# Patient Record
Sex: Male | Born: 1960 | Race: White | Hispanic: No | Marital: Married | Smoking: Never smoker
Health system: Southern US, Community
[De-identification: ages and names within clinical notes are randomized; demographics above are authoritative.]

## PROBLEM LIST (undated history)

## (undated) DIAGNOSIS — K219 Gastro-esophageal reflux disease without esophagitis: Secondary | ICD-10-CM

## (undated) HISTORY — PX: OTHER SURGICAL HISTORY: SHX169

## (undated) HISTORY — DX: Gastro-esophageal reflux disease without esophagitis: K21.9

---

## 1997-06-10 ENCOUNTER — Emergency Department (HOSPITAL_COMMUNITY): Admission: EM | Admit: 1997-06-10 | Discharge: 1997-06-10 | Payer: Self-pay | Admitting: Internal Medicine

## 2014-02-23 ENCOUNTER — Encounter: Payer: Self-pay | Admitting: Family Medicine

## 2014-02-23 ENCOUNTER — Ambulatory Visit (INDEPENDENT_AMBULATORY_CARE_PROVIDER_SITE_OTHER): Payer: 59 | Admitting: Family Medicine

## 2014-02-23 VITALS — BP 118/82 | Temp 98.8°F | Ht 72.0 in | Wt 222.0 lb

## 2014-02-23 DIAGNOSIS — R202 Paresthesia of skin: Secondary | ICD-10-CM

## 2014-02-23 DIAGNOSIS — R1013 Epigastric pain: Secondary | ICD-10-CM

## 2014-02-23 DIAGNOSIS — K219 Gastro-esophageal reflux disease without esophagitis: Secondary | ICD-10-CM | POA: Insufficient documentation

## 2014-02-23 DIAGNOSIS — R200 Anesthesia of skin: Secondary | ICD-10-CM | POA: Insufficient documentation

## 2014-02-23 LAB — COMPREHENSIVE METABOLIC PANEL
ALT: 40 U/L (ref 0–53)
AST: 28 U/L (ref 0–37)
Albumin: 4.4 g/dL (ref 3.5–5.2)
Alkaline Phosphatase: 79 U/L (ref 39–117)
BUN: 29 mg/dL — ABNORMAL HIGH (ref 6–23)
CO2: 28 meq/L (ref 19–32)
CREATININE: 1.05 mg/dL (ref 0.40–1.50)
Calcium: 9.6 mg/dL (ref 8.4–10.5)
Chloride: 106 mEq/L (ref 96–112)
GFR: 77.74 mL/min (ref >60.00)
Glucose, Bld: 111 mg/dL — ABNORMAL HIGH (ref 70–99)
POTASSIUM: 4.7 meq/L (ref 3.5–5.1)
SODIUM: 139 meq/L (ref 135–145)
Total Bilirubin: 0.5 mg/dL (ref 0.2–1.2)
Total Protein: 7.5 g/dL (ref 6.0–8.3)

## 2014-02-23 LAB — CBC WITH DIFFERENTIAL/PLATELET
BASOS ABS: 0 10*3/uL (ref 0.0–0.1)
Basophils Relative: 0.3 % (ref 0.0–3.0)
EOS ABS: 0.3 10*3/uL (ref 0.0–0.7)
EOS PCT: 4.2 % (ref 0.0–5.0)
HCT: 47.3 % (ref 39.0–52.0)
HEMOGLOBIN: 16.3 g/dL (ref 13.0–17.0)
Lymphocytes Relative: 19.1 % (ref 12.0–46.0)
Lymphs Abs: 1.2 10*3/uL (ref 0.7–4.0)
MCHC: 34.5 g/dL (ref 30.0–36.0)
MCV: 89.1 fl (ref 78.0–100.0)
Monocytes Absolute: 0.5 10*3/uL (ref 0.1–1.0)
Monocytes Relative: 7.6 % (ref 3.0–12.0)
NEUTROS PCT: 68.8 % (ref 43.0–77.0)
Neutro Abs: 4.2 10*3/uL (ref 1.4–7.7)
PLATELETS: 337 10*3/uL (ref 150.0–400.0)
RBC: 5.31 Mil/uL (ref 4.22–5.81)
RDW: 13 % (ref 11.5–15.5)
WBC: 6.2 10*3/uL (ref 4.0–10.5)

## 2014-02-23 LAB — LIPASE: Lipase: 23 U/L (ref 11.0–59.0)

## 2014-02-23 MED ORDER — OMEPRAZOLE 40 MG PO CPDR: 40.0000 mg | DELAYED_RELEASE_CAPSULE | Freq: Every day | ORAL | Status: DC

## 2014-02-23 NOTE — Patient Instructions (Signed)
Epigastric abdominal pain  We are going to treat this as if it is an ulcer   High dose PPI prilosec for 2 months  Follow up at that point  See me sooner if new or worsening symptoms   Check labs of liver and kidneys and infection fighting cells  R arm tingling  Could be pinched nerve as you suspected  Opted to treat epigastric pain first and consider antiinflammatory at next visit  You are going to work on stretching and being more active which has helped in past  Stop these activities and see me if worsens

## 2014-02-23 NOTE — Progress Notes (Signed)
Tana ConchStephen Hunter, MD Phone: [phone]  Subjective:  Patient presents today to establish care with me as PCP. Last time he has seen a doctor was when he was [age] or 54 years old. Chief complaint-noted.   Epigastric abdominal pain Burning in epigastric area usually late afternoons. Diet seems to affect it. Overeating makes it worse. Pepcid AC does resolve pain. Denies link to specific foods but more to volume. Light meals are better for it. Burning sensation. Worse if he does not eat a meal as well. Up to 6/10. Daily at least. Going on at least a year.   Coffee- at least 24 oz a day. No regular nsaid use  ROS-No nausea. No shortness of breath. No neck or left arm pain. No exertional component at all.   Right arm tingling worse when laying down. Starts in right upper neck and comes down to the finger. 3-4 months. Staying about the same. History of severe left upper back pain years ago. Used to stretch it out a lot which helped. Rarely with shooting pain. Stretching arm out makes it better.   ROS-No weakness in the arm. Has not dropped anything.   The following were reviewed and entered/updated in epic: Past Medical History  Diagnosis Date  . GERD (gastroesophageal reflux disease)    Patient Active Problem List   Diagnosis Date Noted  . Numbness and tingling of right arm 02/23/2014  . GERD (gastroesophageal reflux disease)    Past Surgical History  Procedure Laterality Date  . None      Family History  Problem Relation Age of Onset  . Esophageal cancer      father-2 pack a day smoker, death within 9 months of diagnosis  . Diabetes Maternal Grandfather     Medications- reviewed and updated Current Outpatient Prescriptions  Medication Sig Dispense Refill  . omeprazole (PRILOSEC) 40 MG capsule Take 1 capsule (40 mg total) by mouth daily. 30 capsule 1   No current facility-administered medications for this visit.    Allergies-reviewed and updated No Known Allergies  History    Social History  . Marital Status: Married    Spouse Name: N/A  . Number of Children: N/A  . Years of Education: N/A   Social History Main Topics  . Smoking status: Never Smoker   . Smokeless tobacco: Not on file  . Alcohol Use: 4.2 - 8.4 oz/week    7-14 Standard drinks or equivalent per week  . Drug Use: No  . Sexual Activity: Not on file   Other Topics Concern  . Not on file   Social History Narrative   Married. 4 children (3 live in area, 1 in [place]). No grandkids. Many Granddogs.       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    [employer] most recently.       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 Oropharynx normal. TM normal. Eyes: sclera and lids normal, PERRLA Neck: no thyromegaly, no cervical lymphadenopathy CV: RRR no murmurs rubs or gallops Lungs: CTAB no crackles, wheeze, rhonchi Abdomen: soft/very mild epigastric tenderness/nondistended/normal bowel sounds. No rebound or guarding.  Ext:  no edema, 2+ PT and radial pulses Skin: warm, dry, no rash Neuro: 5/5 strength in upper and lower extremities, normal gait, normal reflexes  MSK: negative spurlings, cannot reproduce tingling except when he lies down   Assessment/Plan:  Epigastric Abdominal Pain GERD (gastroesophageal reflux disease)? Suspect GERD vs ulcer (worse when doesn't eat concerning for duodenal). Patient with financial concerns. We opted to trial omeprazole high dose x 8 weeks to treat as if ulcer before looking into other potential causes. If rebounds after this, likely GERD. If does not improve, would need to consider EGD  especially with fathers history esophageal cancer (though was a smoker). We discussed also consider H. Pylori testing in future.    Numbness and tingling of right arm Suspect could be related to neck arthritis. Discussed x-rays or trial prednisone but patient declines at this point. He feels like stretches help so he wants to try this first and follow up in 8 weeks as planned for epigastric pain.   Return precautions advised. 8 weeks follow up planned.   Largely reassuring labs.  Results for orders placed or performed in visit on 02/23/14 (from the past 24 hour(s))  CBC with Differential/Platelet     Status: None   Collection Time: 02/23/14  9:08 AM  Result Value Ref Range   WBC 6.2 4.0 - 10.5 K/uL   RBC 5.31 4.22 - 5.81 Mil/uL   Hemoglobin 16.3 13.0 - 17.0 g/dL   HCT 78.2 95.6 - 21.3 %   MCV 89.1 78.0 - 100.0 fl   MCHC 34.5 30.0 - 36.0 g/dL   RDW 08.6 57.8 - 46.9 %   Platelets 337.0 150.0 - 400.0 K/uL   Neutrophils Relative % 68.8 43.0 - 77.0 %   Lymphocytes Relative 19.1 12.0 - 46.0 %   Monocytes Relative 7.6 3.0 - 12.0 %   Eosinophils Relative 4.2 0.0 - 5.0 %   Basophils Relative 0.3 0.0 - 3.0 %   Neutro Abs 4.2 1.4 - 7.7 K/uL   Lymphs Abs 1.2 0.7 - 4.0 K/uL   Monocytes Absolute 0.5 0.1 - 1.0 K/uL   Eosinophils Absolute 0.3 0.0 - 0.7 K/uL   Basophils Absolute 0.0 0.0 - 0.1 K/uL  Comprehensive metabolic panel     Status: Abnormal   Collection Time: 02/23/14  9:08 AM  Result Value Ref Range   Sodium 139 135 - 145 mEq/L   Potassium 4.7 3.5 - 5.1 mEq/L   Chloride 106 96 - 112 mEq/L   CO2 28 19 - 32 mEq/L   Glucose, Bld 111 (H) 70 - 99 mg/dL   BUN 29 (H) 6 - 23 mg/dL   Creatinine, Ser 6.29 0.40 - 1.50 mg/dL   Total Bilirubin 0.5 0.2 - 1.2 mg/dL   Alkaline Phosphatase 79 39 - 117 U/L   AST 28 0 - 37 U/L   ALT 40 0 - 53 U/L   Total Protein 7.5 6.0 - 8.3 g/dL   Albumin 4.4 3.5 - 5.2 g/dL   Calcium 9.6 8.4 - 52.8 mg/dL   GFR 41.32 >44.01 mL/min  Lipase     Status: None    Collection Time: 02/23/14  9:08 AM  Result Value Ref Range   Lipase 23.0 11.0 - 59.0 U/L   Meds ordered this encounter  Medications  . omeprazole (PRILOSEC) 40 MG capsule    Sig: Take 1 capsule (40 mg total) by mouth daily.    Dispense:  30 capsule    Refill:  1

## 2014-02-23 NOTE — Assessment & Plan Note (Signed)
Suspect GERD vs ulcer (worse when doesn't eat concerning for duodenal). Patient with financial concerns. We opted to trial omeprazole high dose x 8 weeks to treat as if ulcer before looking into other potential causes. If rebounds after this, likely GERD. If does not improve, would need to consider EGD especially with fathers history esophageal cancer (though was a smoker). We discussed also consider H. Pylori testing in future.

## 2014-02-23 NOTE — Assessment & Plan Note (Signed)
Suspect could be related to neck arthritis. Discussed x-rays or trial prednisone but patient declines at this point. He feels like stretches help so he wants to try this first and follow up in 8 weeks as planned for epigastric pain.

## 2014-03-19 ENCOUNTER — Ambulatory Visit (INDEPENDENT_AMBULATORY_CARE_PROVIDER_SITE_OTHER): Payer: 59 | Admitting: Family Medicine

## 2014-03-19 ENCOUNTER — Encounter: Payer: Self-pay | Admitting: Family Medicine

## 2014-03-19 VITALS — BP 90/74 | HR 66 | Temp 97.8°F | Ht 72.0 in | Wt 220.0 lb

## 2014-03-19 DIAGNOSIS — Z7251 High risk heterosexual behavior: Secondary | ICD-10-CM

## 2014-03-19 DIAGNOSIS — Z1211 Encounter for screening for malignant neoplasm of colon: Secondary | ICD-10-CM

## 2014-03-19 DIAGNOSIS — R200 Anesthesia of skin: Secondary | ICD-10-CM

## 2014-03-19 DIAGNOSIS — K219 Gastro-esophageal reflux disease without esophagitis: Secondary | ICD-10-CM

## 2014-03-19 DIAGNOSIS — Z0000 Encounter for general adult medical examination without abnormal findings: Secondary | ICD-10-CM

## 2014-03-19 DIAGNOSIS — R739 Hyperglycemia, unspecified: Secondary | ICD-10-CM

## 2014-03-19 DIAGNOSIS — R202 Paresthesia of skin: Secondary | ICD-10-CM

## 2014-03-19 DIAGNOSIS — Z23 Encounter for immunization: Secondary | ICD-10-CM

## 2014-03-19 LAB — HEMOGLOBIN A1C: HEMOGLOBIN A1C: 5.8 % (ref 4.6–6.5)

## 2014-03-19 LAB — LIPID PANEL
CHOLESTEROL: 229 mg/dL — AB (ref 0–200)
HDL: 59.3 mg/dL (ref >39.00)
LDL Cholesterol: 156 mg/dL — ABNORMAL HIGH (ref 0–99)
NonHDL: 169.7
Total CHOL/HDL Ratio: 4
Triglycerides: 69 mg/dL (ref 0.0–149.0)
VLDL: 13.8 mg/dL (ref 0.0–40.0)

## 2014-03-19 LAB — TSH: TSH: 4.32 u[IU]/mL (ref 0.35–4.50)

## 2014-03-19 LAB — PSA: PSA: 0.66 ng/mL (ref 0.10–4.00)

## 2014-03-19 MED ORDER — PREDNISONE 20 MG PO TABS: ORAL_TABLET | ORAL | Status: DC

## 2014-03-19 NOTE — Patient Instructions (Signed)
Weight goal 200-advise exercise and healthy eating.   For the arm numbness-trial prednisone for 7 days. Try your stretches if this doesn't help. We could get neck films or consider sports medicine referral or ortho in future if needed.   Tdap today (tetanus)  Referred for colonoscopy  Update other labs  Advise sunscreen use

## 2014-03-19 NOTE — Progress Notes (Signed)
Mike ConchStephen Hunter, MD Phone: [phone]  Subjective:  Patient presents today for their annual physical. Chief complaint-noted.   Regarding abdominal pain, resolved completely on first day of prilosec 40mg . We had planned for 8 weeks therapy.   Continues to have intermittent right arm numbness.   Weight down 2 lbs. But goal 200 lbs. No exercise, advise.   agreeds to colonoscopy  Does not see dermatology  ROS- full  review of systems was completed and negative except for: had 1 hour of knee pain after getting up from being on knees which completely resolved within an hour. Other pertinent negatives-no arm weakness, no chest pain, nausea, vomiting, left arm pain or neck pain, diaphoresis.   The following were reviewed and entered/updated in epic: Past Medical History  Diagnosis Date  . GERD (gastroesophageal reflux disease)    Patient Active Problem List   Diagnosis Date Noted  . Numbness and tingling of right arm 02/23/2014    Priority: Medium  . GERD (gastroesophageal reflux disease)     Priority: Medium   Past Surgical History  Procedure Laterality Date  . None      Family History  Problem Relation Age of Onset  . Esophageal cancer      father-2 pack a day smoker, death within 9 months of diagnosis  . Diabetes Maternal Grandfather     Medications- reviewed and updated Current Outpatient Prescriptions  Medication Sig Dispense Refill  . omeprazole (PRILOSEC) 40 MG capsule Take 1 capsule (40 mg total) by mouth daily. 30 capsule 1  . predniSONE (DELTASONE) 20 MG tablet Take 2 tabs for 3 days then 1 tab for 4 days 7 tablet 0   No current facility-administered medications for this visit.    Allergies-reviewed and updated No Known Allergies  History   Social History  . Marital Status: Married    Spouse Name: N/A  . Number of Children: N/A  . Years of Education: N/A   Social History Main Topics  . Smoking status: Never Smoker   . Smokeless tobacco: Not on file   . Alcohol Use: 4.2 - 8.4 oz/week    7-14 Standard drinks or equivalent per week  . Drug Use: No  . Sexual Activity: Not on file   Other Topics Concern  . None   Social History Narrative   Married. 4 children (3 live in area, 1 in [place]). No grandkids. Many Granddogs.       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    [employer] most recently.       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 Oropharynx normal. Some plaque (advised dentist) Neck: no thyromegaly, no lymphadenopathy CV: RRR no murmurs rubs or gallops Lungs: CTAB no crackles, wheeze, rhonchi Abdomen: soft/nontender/nondistended/normal bowel sounds. No rebound or guarding.  Ext: no edema, 2+ PT pulses Skin: warm, dry, 7 x 4 mm brown patch oval shaped on R neck Neuro: grossly normal, moves all extremities, PERRLA  Assessment/Plan:  54 y.o. male presenting for annual physical.  Health Maintenance counseling: 1. Anticipatory guidance: Patient counseled regarding regular dental exams (cannot afford), wearing seatbelts, wearing sunscreen 2. Risk factor reduction:  Advised patient of need for  regular exercise and diet rich and fruits and vegetables to reduce risk of heart attack and stroke.  3. Immunizations/screenings/ancillary studies Health Maintenance Due  Topic Date Due  . HIV Screening -today 06/06/1973  . TETANUS/TDAP - today 06/06/1977  . COLONOSCOPY - referred  06/06/2008   GERD (gastroesophageal reflux disease) Prilosec  for 8 weeks in case this was ulcer related though with improvement in 1 day I doubt. THen 1 week off and update me, if symptoms recur and persist will plan on prilosec . F/u  6 weeks by phone approximately   Numbness and tingling of right arm Still no weakness. 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 We discussed costs of x-ray and patient opted for therapeutic trial of prednisone. He will update me when he calls in 6 weeks or sends [employer].    Return precautions advised. Keep an eye at least yearly on spot on R neck (sooner if any change)  Orders Placed This Encounter  Procedures  . Hemoglobin A1c    [place]  . Lipid panel    [place]    Order Specific Question:  Has the patient fasted?    Answer:  No  . TSH    [place]  . PSA  . HIV antibody (with reflex)  . Ambulatory referral to Gastroenterology    Referral Priority:  Routine    Referral Type:  Consultation    Referral Reason:  Specialty Services Required    Requested Specialty:  Gastroenterology    Number of Visits Requested:  1   Meds ordered this encounter  Medications  . predniSONE (DELTASONE) 20 MG tablet    Sig: Take 2 tabs for 3 days then 1 tab for 4 days    Dispense:  7 tablet    Refill:  0

## 2014-03-19 NOTE — Assessment & Plan Note (Signed)
Still no weakness. 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 We discussed costs of x-ray and patient opted for therapeutic trial of prednisone. He will update me when he calls in 6 weeks or sends [employer].

## 2014-03-19 NOTE — Assessment & Plan Note (Signed)
Prilosec 40mg  for 8 weeks in case this was ulcer related though with improvement in 1 day I doubt. THen 1 week off and update me, if symptoms recur and persist will plan on prilosec 20mg . F/u 6 weeks by phone approximately

## 2014-03-20 LAB — HIV ANTIBODY (ROUTINE TESTING W REFLEX): HIV 1&2 Ab, 4th Generation: NONREACTIVE

## 2014-04-24 ENCOUNTER — Encounter: Payer: Self-pay | Admitting: Gastroenterology

## 2014-04-27 ENCOUNTER — Ambulatory Visit: Payer: 59 | Admitting: Family Medicine

## 2014-06-02 ENCOUNTER — Ambulatory Visit (INDEPENDENT_AMBULATORY_CARE_PROVIDER_SITE_OTHER): Payer: 59 | Admitting: Family Medicine

## 2014-06-02 ENCOUNTER — Encounter: Payer: Self-pay | Admitting: Family Medicine

## 2014-06-02 VITALS — BP 142/88 | HR 87 | Temp 98.0°F | Wt 220.0 lb

## 2014-06-02 DIAGNOSIS — K219 Gastro-esophageal reflux disease without esophagitis: Secondary | ICD-10-CM | POA: Diagnosis not present

## 2014-06-02 NOTE — Patient Instructions (Addendum)
Let's target a weight of at least 200 lbs or less  Continue zantac 150mg  1-2x a day  Follow up in 3 months  If no improvement with weight loss or progressive symptoms, consider GI referral for endoscopy with family history  Still would be great to have the colonsocopy-could do them together if needed  Health Maintenance Due  Topic Date Due  . COLONOSCOPY  06/06/2008

## 2014-06-02 NOTE — Assessment & Plan Note (Signed)
Patient very anxious due to family history of esophageal cancer as well as concern for SE medications. >50% of 20 minute office visit was spent on counseling and coordination of care as a result. We ultimately opted to work on weight loss weigh goal 20 lb weight loss (down to 200) and see if he has improvement in symptoms. COntinue zantac prn for now and he wants to take just 150mg  once a day though I told him BID would give better control- hesitant due to SE and not wanting to take regular medication. Follow up in 3 months and if persistent would refer to GI with family history esophageal cancer for consideration EGD- would need colonoscopy at same time as has not followed through with scheduling yet.

## 2014-06-02 NOTE — Progress Notes (Signed)
Mike ConchStephen Tashawn Greff, MD  Subjective:  Mike Edwards is a 54 y.o. year old very pleasant male patient who presents with:  GERD- reasonable control -02/23/14 prilosec for 8 weeks omeprazole 40mg  due to concern for ulceration but had immediate improvement after a day of treatment and after 8 weeks symptoms recurred when came off medicine. Marland Kitchen. Restarted PPI but trouble urinating (trickling) so sstopped and SE immediately resolved.  Symptoms still epigastric discomfort but now tends to start around umbilicus and build up instead of directly starting in the epigastric area. Newly started-Zantac 150mg  just once a day makes symptoms go away but then slowly comes back ROS- no unintentional weight loss, hematemesis, night sweats, fever, chills, fatigue   Past Medical History- never smoker Family history- esophageal cancer in father who was a smoker  Medications- reviewed and updated Current Outpatient Prescriptions  Medication Sig Dispense Refill  . omeprazole (PRILOSEC) 40 MG capsule Take 1 capsule (40 mg total) by mouth daily. (Patient not taking: Reported on 06/02/2014) 30 capsule 1     Objective: BP 142/88 mmHg  Pulse 87  Temp(Src) 98 F (36.7 C)  Wt 220 lb (99.791 kg) Gen: NAD, resting comfortably CV: RRR no murmurs rubs or gallops Lungs: CTAB no crackles, wheeze, rhonchi Abdomen: soft/nontender/nondistended/normal bowel sounds. No rebound or guarding. overweight Ext: no edema Skin: warm, dry, no rash Neuro: grossly normal, moves all extremities   Assessment/Plan:  GERD (gastroesophageal reflux disease) Patient very anxious due to family history of esophageal cancer as well as concern for SE medications. >50% of 20 minute office visit was spent on counseling and coordination of care as a result. We ultimately opted to work on weight loss weigh goal 20 lb weight loss (down to 200) and see if he has improvement in symptoms. COntinue zantac prn for now and he wants to take just 150mg  once a  day though I told him BID would give better control- hesitant due to SE and not wanting to take regular medication. Follow up in 3 months and if persistent would refer to GI with family history esophageal cancer for consideration EGD- would need colonoscopy at same time as has not followed through with scheduling yet.     Return precautions advised.

## 2014-10-08 ENCOUNTER — Telehealth: Payer: Self-pay | Admitting: Family Medicine

## 2014-10-08 DIAGNOSIS — Z1211 Encounter for screening for malignant neoplasm of colon: Secondary | ICD-10-CM

## 2014-10-08 NOTE — Telephone Encounter (Signed)
Pt needs an endoscopy due to gerd and also colonoscopy due to age. Can we refer?

## 2014-10-08 NOTE — Telephone Encounter (Signed)
Referral has been placed, GI will call pt to schedule appointment.

## 2014-10-16 ENCOUNTER — Telehealth: Payer: Self-pay | Admitting: Family Medicine

## 2014-10-16 NOTE — Telephone Encounter (Signed)
Pt call to say that [facility] can not see him till 12/16/ and he said he will not have insurance then. He said he would like a call back because per his conversation with Dr Durene CalHunter he wanted it sooner.

## 2014-10-19 NOTE — Telephone Encounter (Signed)
Do you remember this conversation? If so, do you want him seen sooner that his 12/16 appointment?

## 2014-10-20 NOTE — Telephone Encounter (Signed)
Pt said gi dept will not be able to schedule colonoscopy and endoscopy before end of year. Please advise

## 2014-10-20 NOTE — Telephone Encounter (Signed)
Call GI and see if they can move it up due to insurance reasons. Need to know when his insurance runs out of course- so call patient. THanks

## 2014-10-20 NOTE — Telephone Encounter (Signed)
Pt notified and he will call GI back himself.

## 2014-10-21 NOTE — Telephone Encounter (Signed)
Lm on pt vm tcb  

## 2014-10-21 NOTE — Telephone Encounter (Signed)
Unsure what we can offer patient if he does not want us to call and he has not given us information on when insurance runs out. i advise he keep scheduled appointment- and would advise that he not run out of insurance

## 2014-10-21 NOTE — Telephone Encounter (Signed)
See below

## 2014-10-26 ENCOUNTER — Telehealth: Payer: Self-pay | Admitting: Family Medicine

## 2014-10-26 NOTE — Telephone Encounter (Signed)
Ok if his appointment is 12/16, is it possible they could set him up for colonoscopy and endoscopy before the year? Keba please call GI and ask this question. If the cannot, can they bump his initial appointment up sooner, do I need to talk to one of the doctors.   Ardine BjorkKeba- also before you call- ask patient will he not have insurance next year to be able to get it done?

## 2014-10-26 NOTE — Telephone Encounter (Signed)
Pt insurance runs out on 01/02/15

## 2014-10-26 NOTE — Telephone Encounter (Signed)
I spoke with patient in detail about his $8.88 balance. I explained why [employer] [employer] applied the labs to his coinsurance. Patient was very upset and states he specifically stated that only preventative dx codes could be used. He is also very upset that he is unable to have his colonoscopy before Jan 1 because he will lose his insurance plan on 12/31 of this year. Would like to talk with dr Durene CalHunter to see if there is another location that can see him sooner.

## 2014-10-26 NOTE — Telephone Encounter (Signed)
Pt is calling concerning a small bill  8.88 balance. Pt has lab work and per pt he is covered at 100 percent

## 2014-10-27 NOTE — Telephone Encounter (Signed)
Deborah please see if Dr. Loreta AveMann, Dr. K[facility]Medoff or [facility] before the end of year.

## 2014-10-27 NOTE — Telephone Encounter (Signed)
See below

## 2014-10-27 NOTE — Telephone Encounter (Signed)
Sent to Gavin PoundDeborah to see if pt can be seen sooner at another office.

## 2014-11-09 ENCOUNTER — Ambulatory Visit (INDEPENDENT_AMBULATORY_CARE_PROVIDER_SITE_OTHER): Payer: 59 | Admitting: Family Medicine

## 2014-11-09 ENCOUNTER — Telehealth: Payer: Self-pay | Admitting: Family Medicine

## 2014-11-09 ENCOUNTER — Encounter: Payer: Self-pay | Admitting: Family Medicine

## 2014-11-09 VITALS — BP 100/70 | HR 89 | Temp 98.3°F | Wt 221.0 lb

## 2014-11-09 DIAGNOSIS — R0602 Shortness of breath: Secondary | ICD-10-CM

## 2014-11-09 DIAGNOSIS — R05 Cough: Secondary | ICD-10-CM | POA: Diagnosis not present

## 2014-11-09 DIAGNOSIS — R059 Cough, unspecified: Secondary | ICD-10-CM

## 2014-11-09 MED ORDER — PREDNISONE 20 MG PO TABS: ORAL_TABLET | ORAL | Status: DC

## 2014-11-09 MED ORDER — ALBUTEROL SULFATE HFA 108 (90 BASE) MCG/ACT IN AERS: 2.0000 | INHALATION_SPRAY | Freq: Four times a day (QID) | RESPIRATORY_TRACT | Status: DC | PRN

## 2014-11-09 NOTE — Progress Notes (Signed)
Tana Conch, MD  Subjective:  Mike Edwards is a 54 y.o. year old very pleasant male patient who presents for/with See problem oriented charting ROS- No fever, trouble swallowing. No wheeze.   Past Medical History-  Patient Active Problem List   Diagnosis Date Noted  . Numbness and tingling of right arm 02/23/2014    Priority: Medium  . GERD (gastroesophageal reflux disease)     Priority: Medium    Medications- reviewed and updated Current Outpatient Prescriptions  Medication Sig Dispense Refill  . omeprazole (PRILOSEC) 40 MG capsule Take 1 capsule (40 mg total) by mouth daily. 30 capsule 1   No current facility-administered medications for this visit.    Objective: BP 100/70 mmHg  Pulse 89  Temp(Src) 98.3 F (36.8 C)  Wt 221 lb (100.245 kg) Gen: NAD, resting comfortably, several coughing spells especially after deep breath Maxillary sinus tenderness. Tm normal. Oropharynx with some drainage but otherwise largely normal. Nares noted to have erythema and some yellow discharge.  CV: RRR no murmurs rubs or gallops. No chest wall tenderness Lungs: CTAB no crackles, wheeze, rhonchi Abdomen: soft/nontender/nondistended/normal bowel sounds. No rebound or guarding.  Ext: no edema Skin: warm, dry Neuro: grossly normal, moves all extremities  EKG: Sinus rhythm, rate 81, normal axis, normal intervals, no hypertrophy, no st or t wave changes  Assessment/Plan:  Cough, shortness of breath, chest tightness before coughing spell S:2 weeks. Getting worse Dry cough Light sinus pressure Tight spot in upper chest or to the right. Chest tightness Catches him randomly and next thing he knows Feels shortness of breath and then gets into coughing fit. No exertional component and not relived by rest.  Tried Mucinex- metallic taste but no sputum A/P:Suspect Bronchitis Given cough, congestion- doubt cardiac cause. No exertional component. EKG reassuring though obviously doesn't rule  out cardiac disease Does also have some sinus pressure- if this persisted through prednisone would consider Augmentin for sinusitis bacterial May trial albuterol if affordable. Treat with Prednisone for 7 days though discussed not curative.  Discussed 4-6 weeks and antibiotic unlikely to help as bronchitis large portion viral With 2 weeks of symptoms- advised CXR to rule out pneumonia.   Patient's bigger concern is that this is linked to his GERD issues and family history -see prior notes- he wants referral to GI to have EGD and colonoscopy done before end of year as states will lose his insurance.  Message to patient "Regarding Colonoscopy and EGD- we have been informed that Dr. Elnoria Howard will be doing your colonoscopy before end of year. Their fax machine is down today but should be up tomorrow so expect a call with an appointment by Wednesday. If not- you can either call me or send me a mychart message (I am more likely to respond directly through mychart)" Emergent/sooner return precautions advised  Orders Placed This Encounter  Procedures  . DG Chest 2 View    Standing Status: Future     Number of Occurrences:      Standing Expiration Date: 01/09/2016    Order Specific Question:  Reason for Exam (SYMPTOM  OR DIAGNOSIS REQUIRED)    Answer:  cough, congestion x 2 weeks    Order Specific Question:  Preferred imaging location?    Answer:  [facility]  . EKG 12-Lead    Meds ordered this encounter  Medications  . predniSONE (DELTASONE) 20 MG tablet    Sig: Take 2 pills for 3 days, 1 pill for 4 days    Dispense:  10 tablet    Refill:  0  . albuterol (PROVENTIL HFA;VENTOLIN HFA) 108 (90 BASE) MCG/ACT inhaler    Sig: Inhale 2 puffs into the lungs every 6 (six) hours as needed for wheezing or shortness of breath.    Dispense:  1 Inhaler    Refill:  0

## 2014-11-09 NOTE — Patient Instructions (Addendum)
Suspect Bronchitis May trial albuterol to open you up more if affordable- if not do not fear Prednisone for 7 days  With lingering sinus issues- if this does not clear and continue to have issues with sinuses- trial augmentin 1 week from today  If new or worsening symptoms specifically fever or shortness of breath please seek care  Regarding Colonoscopy and EGD- we have been informed that Dr. Elnoria HowardHung will be doing your colonoscopy before end of year. Their fax machine is down today but should be up tomorrow so expect a call with an appointment by Wednesday. If not- you can either call me or send me a mychart message (I am more likely to respond directly through [facility])

## 2014-11-09 NOTE — Telephone Encounter (Signed)
Patient scheduled today at 3:30 to see Dr. Durene CalHunter. Patient is aware we are having to work him in and may have to wait. Patient verbalized understanding.

## 2014-11-09 NOTE — Telephone Encounter (Signed)
Patient Name: Mike Edwards  DOB: 03/08/1960    Initial Comment Caller having SOB, cough.    Nurse Assessment  Nurse: Mike JamesNoe, RN, Mike Edwards Date/Time Mike Edwards(Eastern Time): 11/09/2014 12:43:13 PM  Confirm and document reason for call. If symptomatic, describe symptoms. ---Patient states he has shortness of breath, tightness in his chest when he takes a deep breath and it causes him to cough.  Has the patient traveled out of the country within the last 30 days? ---No  Does the patient have any new or worsening symptoms? ---Yes  Will a triage be completed? ---Yes  Related visit to physician within the last 2 weeks? ---No  Does the PT have any chronic conditions? (i.e. diabetes, asthma, etc.) ---Yes  List chronic conditions. ---"acid reflux,     Guidelines    Guideline Title Affirmed Question Affirmed Notes  Breathing Difficulty [1] MODERATE difficulty breathing (e.g., speaks in phrases, SOB even at rest, pulse 100-120) AND [2] NEW-onset or WORSE than normal    Final Disposition User   Go to ED Now Mike JamesNoe, RN, [name]    Referrals  REFERRED TO PCP OFFICE   Disagree/Comply: Disagree  Disagree/Comply Reason: Disagree with instructions   After triage patient refused ED and states he wants to see his doctor. Notified the office.

## 2014-11-11 ENCOUNTER — Other Ambulatory Visit: Payer: Self-pay | Admitting: Family Medicine

## 2014-11-11 ENCOUNTER — Telehealth: Payer: Self-pay | Admitting: Family Medicine

## 2014-11-11 DIAGNOSIS — K219 Gastro-esophageal reflux disease without esophagitis: Secondary | ICD-10-CM

## 2014-11-11 DIAGNOSIS — Z1211 Encounter for screening for malignant neoplasm of colon: Secondary | ICD-10-CM

## 2014-11-11 NOTE — Telephone Encounter (Signed)
Misty StanleyLisa can be reached at [phone] if you have any questions

## 2014-11-11 NOTE — Telephone Encounter (Signed)
Misty StanleyLisa from [facility] called regarding an referral for this patient. Patient had been referred over to get a colonoscopy and after reading the office notes they saw  GERD within the notes and wanted to know if the MD would K21.9 as a diagnosis on the referral.

## 2014-11-11 NOTE — Telephone Encounter (Signed)
See below

## 2014-11-11 NOTE — Telephone Encounter (Signed)
Mike Edwards from [facility] notified.

## 2014-11-11 NOTE — Telephone Encounter (Signed)
Yes thanks. Patient is requesting both colonoscopy for screening as well as EGD for GERD and family history esophageal issues

## 2014-11-23 ENCOUNTER — Telehealth: Payer: Self-pay | Admitting: Internal Medicine

## 2014-11-23 NOTE — Telephone Encounter (Signed)
Will forward to MR nurse to see if paperwork has been received

## 2014-11-24 NOTE — Telephone Encounter (Signed)
Referral is upfront in referral stack, Dr. Jeani HawkingPatrick Hung requesting appt before 12/16/14 for colonoscopy

## 2014-11-25 NOTE — Telephone Encounter (Signed)
Mike Edwards for Lupita LeashDonna at [facility]. MR has no available openings. Will need to see if the pt can see another provider.

## 2014-11-27 NOTE — Telephone Encounter (Signed)
Called [facility] and was advised office closed. WCB

## 2014-11-30 ENCOUNTER — Other Ambulatory Visit: Payer: Self-pay | Admitting: Family Medicine

## 2014-11-30 DIAGNOSIS — R059 Cough, unspecified: Secondary | ICD-10-CM

## 2014-11-30 DIAGNOSIS — R0602 Shortness of breath: Secondary | ICD-10-CM

## 2014-11-30 DIAGNOSIS — R05 Cough: Secondary | ICD-10-CM

## 2014-11-30 NOTE — Telephone Encounter (Signed)
Called and spoke to Mike Edwards. Pt is scheduled to see Dr. Sherene SiresWert on 12/22/14. Nothing further needed at this time.

## 2014-12-01 LAB — HM COLONOSCOPY: HM Colonoscopy: NORMAL

## 2014-12-02 ENCOUNTER — Encounter: Payer: Self-pay | Admitting: Family Medicine

## 2014-12-02 DIAGNOSIS — Z86010 Personal history of colonic polyps: Secondary | ICD-10-CM | POA: Insufficient documentation

## 2014-12-04 ENCOUNTER — Encounter: Payer: Self-pay | Admitting: Family Medicine

## 2014-12-16 ENCOUNTER — Ambulatory Visit: Payer: 59 | Admitting: Internal Medicine

## 2014-12-22 ENCOUNTER — Encounter: Payer: Self-pay | Admitting: Internal Medicine

## 2014-12-22 ENCOUNTER — Other Ambulatory Visit (INDEPENDENT_AMBULATORY_CARE_PROVIDER_SITE_OTHER): Payer: 59

## 2014-12-22 ENCOUNTER — Ambulatory Visit (INDEPENDENT_AMBULATORY_CARE_PROVIDER_SITE_OTHER): Payer: 59 | Admitting: Internal Medicine

## 2014-12-22 ENCOUNTER — Ambulatory Visit (INDEPENDENT_AMBULATORY_CARE_PROVIDER_SITE_OTHER)
Admission: RE | Admit: 2014-12-22 | Discharge: 2014-12-22 | Disposition: A | Discharge status: Home / Self Care | Payer: 59 | Source: Ambulatory Visit | Attending: Internal Medicine | Admitting: Internal Medicine

## 2014-12-22 VITALS — BP 112/64 | HR 90 | Ht 72.0 in | Wt 212.2 lb

## 2014-12-22 DIAGNOSIS — R05 Cough: Secondary | ICD-10-CM | POA: Diagnosis not present

## 2014-12-22 DIAGNOSIS — R053 Chronic cough: Secondary | ICD-10-CM

## 2014-12-22 LAB — CBC WITH DIFFERENTIAL/PLATELET
BASOS PCT: 0.4 % (ref 0.0–3.0)
Basophils Absolute: 0 10*3/uL (ref 0.0–0.1)
EOS PCT: 3.6 % (ref 0.0–5.0)
Eosinophils Absolute: 0.3 10*3/uL (ref 0.0–0.7)
HCT: 47.1 % (ref 39.0–52.0)
HEMOGLOBIN: 16.1 g/dL (ref 13.0–17.0)
LYMPHS ABS: 1.5 10*3/uL (ref 0.7–4.0)
Lymphocytes Relative: 21.1 % (ref 12.0–46.0)
MCHC: 34.1 g/dL (ref 30.0–36.0)
MCV: 88.8 fl (ref 78.0–100.0)
MONO ABS: 0.6 10*3/uL (ref 0.1–1.0)
Monocytes Relative: 8.7 % (ref 3.0–12.0)
Neutro Abs: 4.6 10*3/uL (ref 1.4–7.7)
Neutrophils Relative %: 66.2 % (ref 43.0–77.0)
Platelets: 379 10*3/uL (ref 150.0–400.0)
RBC: 5.3 Mil/uL (ref 4.22–5.81)
RDW: 13 % (ref 11.5–15.5)
WBC: 7 10*3/uL (ref 4.0–10.5)

## 2014-12-22 MED ORDER — PREDNISONE 10 MG PO TABS: ORAL_TABLET | ORAL | Status: DC

## 2014-12-22 MED ORDER — TRAMADOL HCL 50 MG PO TABS: ORAL_TABLET | ORAL | Status: DC

## 2014-12-22 MED ORDER — FAMOTIDINE 20 MG PO TABS: ORAL_TABLET | ORAL | Status: DC

## 2014-12-22 NOTE — Patient Instructions (Addendum)
The key to effective treatment for your cough is eliminating the non-stop cycle of cough you're stuck in long enough to let your airway heal completely and then see if there is anything still making you cough once you stop the cough suppression, but this should take no more than 5 days to figure out  First take delsym two tsp every 12 hours and supplement if needed with  tramadol 50 mg up to 2 every 4 hours to suppress the urge to cough at all or even clear your throat. Swallowing water or using ice chips/non mint and menthol containing candies (such as lifesavers or sugarless jolly ranchers) are also effective.  You should rest your voice and avoid activities that you know make you cough.  Once you have eliminated the cough for 3 straight days try reducing the tramadol first,  then the delsym as tolerated.    Prednisone 10 mg take  4 each am x 2 days,   2 each am x 2 days,  1 each am x 2 days and stop (this is to eliminate allergies and inflammation from coughing)  prilosec 20 mg x 2 Take 30-60 min before first meal of the day and Pepcid 20 mg one bedtime   For  throat tickle try take CHLORPHENIRAMINE  4 mg - take one every 4 hours as needed - available over the counter- may cause drowsiness so start with just a bedtime dose or two and see how you tolerate it before trying in daytime    GERD (REFLUX)  is an extremely common cause of respiratory symptoms, many times with no significant heartburn at all.    It can be treated with medication, but also with lifestyle changes including avoidance of late meals, excessive alcohol, smoking cessation, and avoid fatty foods, chocolate, peppermint, colas, red wine, and acidic juices such as orange juice.  NO MINT OR MENTHOL PRODUCTS SO NO COUGH DROPS  USE HARD CANDY INSTEAD (jolley ranchers or Stover's or Lifesavers (all available in sugarless versions) NO OIL BASED VITAMINS - use powdered substitutes.  Please remember to go to the lab and x-ray department  downstairs for your tests - we will call you with the results when they are available.  If not better by this time next week return with all active medications in hand to regroup

## 2014-12-22 NOTE — Progress Notes (Signed)
Subjective:    Patient ID: Mike Edwards, male    DOB: [birth year],    MRN: [id number]  HPI  9 yowm never smoked very athletic but stopped around 2006 at wt - 8 with onset variable sob since 2014 assoc with bad coughing so referred to pulmonary clinic 12/22/2014 by Dr Durene Cal.   12/22/2014 1st [facility] office visit/ Wert   Chief Complaint  Patient presents with  . Pulmonary Consult    Referred by Dr. Durene Cal. Pt c/o cough and SOB for the past 2 yrs. He states that today, these symptoms are the best they have been in a long time. His cough tends to be non prod. He is mainly only SOB at rest.   may have waxed and waned 2 years prior to OV   but has been present daily x one year  onset is typically with a tickle in upper midline chest  then severe paroxysm some better on pred, some better on inhalers x 2 min with documented GERD on ppi on one to two daily but trying to wean off  Never noct/ only sob when coughing  Convinced he has something stuck in his airway "feels like I inhaled a feather I can't up" = very skeptical that a cough can cause a cough.    Kouffman Reflux v Neurogenic Cough Differentiator Reflux Comments  Do you awaken from a sound sleep coughing violently?                            With trouble breathing? no   Do you have choking episodes when you cannot  Get enough air, gasping for air ?              Yes   Do you usually cough when you lie down into  The bed, or when you just lie down to rest ?                          no   Do you usually cough after meals or eating?         no   Do you cough when (or after) you bend over?    Not aware    GERD SCORE     Kouffman Reflux v Neurogenic Cough Differentiator Neurogenic   Do you more-or-less cough all day long? sporadic   Does change of temperature make you cough? no   Does laughing or chuckling cause you to cough? Not aware    Do fumes (perfume, automobile fumes, burned  Toast, etc.,) cause you to cough ?       no   Does speaking, singing, or talking on the phone cause you to cough   ?               Yes phone    Neurogenic/Airway score      No obvious other patterns in day to day or daytime variabilty or assoc  cp or chest tightness, subjective wheeze overt sinus or hb symptoms. No unusual exp hx or h/o childhood pna/ asthma or knowledge of premature birth.  Sleeping ok without nocturnal  or early am exacerbation  of respiratory  c/o's or need for noct saba. Also denies any obvious fluctuation of symptoms with weather or environmental changes or other aggravating or alleviating factors except as outlined above   Current Medications, Allergies, Complete Past Medical History, Past Surgical History, Family  History, and Social History were reviewed in [facility] electronic medical record.            Review of Systems  Constitutional: Negative for fever, chills, activity change, appetite change and unexpected weight change.  HENT: Negative for congestion, dental problem, postnasal drip, rhinorrhea, sneezing, sore throat, trouble swallowing and voice change.   Eyes: Negative for visual disturbance.  Respiratory: Positive for cough and shortness of breath. Negative for choking.   Cardiovascular: Negative for chest pain and leg swelling.  Gastrointestinal: Negative for nausea, vomiting and abdominal pain.  Genitourinary: Negative for difficulty urinating.       Acid heartburn Indigestion  Musculoskeletal: Negative for arthralgias.  Skin: Negative for rash.  Psychiatric/Behavioral: Negative for behavioral problems and confusion.       Objective:   Physical Exam   amb wm nad / very somber and pessimistic   Wt Readings from Last 3 Encounters:  12/22/14 212 lb 3.2 oz (96.253 kg)  11/09/14 221 lb (100.245 kg)  06/02/14 220 lb (99.791 kg)    Vital signs reviewed     HEENT: nl dentition, turbinates, and oropharynx. Nl external ear canals without cough reflex   NECK :  without  JVD/Nodes/TM/ nl carotid upstrokes bilaterally   LUNGS: no acc muscle use,  Nl contour chest which is clear to A and P bilaterally without cough on insp or exp maneuvers   CV:  RRR  no s3 or murmur or increase in P2, no edema   ABD:  soft and nontender with nl inspiratory excursion in the supine position. No bruits or organomegaly, bowel sounds nl  MS:  Nl gait/ ext warm without deformities, calf tenderness, cyanosis or clubbing No obvious joint restrictions   SKIN: warm and dry without lesions    NEURO:  alert, approp, nl sensorium with  no motor deficits   CXR PA and Lateral:   12/22/2014 :    I personally reviewed images and agree with radiology impression as follows:    wnl  Labs sent 12/22/2014 = cbc with diff/ allergy profile        Assessment & Plan:

## 2014-12-23 ENCOUNTER — Telehealth: Payer: Self-pay | Admitting: *Deleted

## 2014-12-23 LAB — ALLERGY FULL PROFILE
Allergen, D pternoyssinus,d7: 7.89 kU/L — ABNORMAL HIGH
Allergen,Goose feathers, e70: <0.1 kU/L
Aspergillus fumigatus, m3: <0.1 kU/L
Bahia Grass: <0.1 kU/L
Bermuda Grass: <0.1 kU/L
Box Elder IgE: <0.1 kU/L
Common Ragweed: <0.1 kU/L
D. farinae: 7.93 kU/L — ABNORMAL HIGH
Dog Dander: <0.1 kU/L
G005 Rye, Perennial: <0.1 kU/L
G009 Red Top: <0.1 kU/L
Goldenrod: <0.1 kU/L
House Dust Hollister: 0.36 kU/L — ABNORMAL HIGH
IGE (IMMUNOGLOBULIN E), SERUM: 34 kU/L (ref <115)
Oak: <0.1 kU/L
Stemphylium Botryosum: <0.1 kU/L
Sycamore Tree: <0.1 kU/L

## 2014-12-23 NOTE — Assessment & Plan Note (Signed)
The most common causes of chronic cough in immunocompetent adults include the following: upper airway cough syndrome (UACS), previously referred to as postnasal drip syndrome (PNDS), which is caused by variety of rhinosinus conditions; (2) asthma; (3) GERD; (4) chronic bronchitis from cigarette smoking or other inhaled environmental irritants; (5) nonasthmatic eosinophilic bronchitis; and (6) bronchiectasis.   These conditions, singly or in combination, have accounted for up to 94% of the causes of chronic cough in prospective studies.   Other conditions have constituted no >6% of the causes in prospective studies These have included bronchogenic carcinoma, chronic interstitial pneumonia, sarcoidosis, left ventricular failure, ACEI-induced cough, and aspiration from a condition associated with pharyngeal dysfunction.    Chronic cough is often simultaneously caused by more than one condition. A single cause has been found from 38 to 82% of the time, multiple causes from 18 to 62%. Multiply caused cough has been the result of three diseases up to 42% of the time.       Based on hx and exam, this is most likely:    Upper airway cough syndrome, so named because it's frequently impossible to sort out how much is  CR/sinusitis with freq throat clearing (which can be related to primary GERD)   vs  causing  secondary (" extra esophageal")  GERD from wide swings in gastric pressure that occur with throat clearing, often  promoting self use of mint and menthol lozenges that reduce the lower esophageal sphincter tone and exacerbate the problem further in a cyclical fashion.   These are the same pts (now being labeled as having "irritable larynx syndrome" by some cough centers) who not infrequently have a history of having failed to tolerate ace inhibitors,  dry powder inhalers or biphosphonates or report having atypical reflux symptoms that don't respond to standard doses of PPI , and are easily confused as having  aecopd or asthma flares by even experienced allergists/ pulmonologists.   The first step is to maximize acid suppression and eliminate cyclical coughing then regroup if the cough persists with methacholine challenge while on GERD rx next step in algorithm   I had an extended discussion with the patient reviewing all relevant studies completed to date and  lasting 35 min  1) Explained: The standardized cough guidelines published in Chest by [employer] Irwin in 2006 are still the best available and consist of a multiple step process (up to 12!) , not a single office visit,  and are intended  to address this problem logically,  with an alogrithm dependent on response to empiric treatment at  each progressive step  to determine a specific diagnosis with  minimal addtional testing needed. Therefore if adherence is an issue or can't be accurately verified,  it's very unlikely the standard evaluation and treatment will be successful here.    Furthermore, response to therapy (other than acute cough suppression, which should only be used short term with avoidance of narcotic containing cough syrups if possible), can be a gradual process for which the patient may perceive immediate benefit.  Unlike going to an eye doctor where the best perscription is almost always the first one and is immediately effective, this is almost never the case in the management of chronic cough syndromes. Therefore the patient needs to commit up front to consistently adhere to recommendations  for up to 6 weeks of therapy directed at the likely underlying problem(s) before the response can be reasonably evaluated.     2) Each maintenance medication was reviewed in  detail including most importantly the difference between maintenance and prns and under what circumstances the prns are to be triggered using an action plan format that is not reflected in the computer generated alphabetically organized AVS.    Please see instructions for  details which were reviewed in writing and the patient given a copy highlighting the part that I personally wrote and discussed at today's ov.   See instructions for specific recommendations which were reviewed directly with the patient who was given a copy with highlighter outlining the key components.

## 2014-12-23 NOTE — Telephone Encounter (Addendum)
Attempted to contact patient regarding CXR  Left message for patient to call back.   ----- Message from Nyoka CowdenMichael B Wert, MD sent at 12/23/2014  6:12 AM EST ----- Call pt:  Reviewed cxr and no acute change so [facility]

## 2014-12-24 NOTE — Telephone Encounter (Signed)
Result note in leslie's box and pt has already been made aware

## 2015-05-20 ENCOUNTER — Telehealth: Payer: Self-pay | Admitting: Family Medicine

## 2015-05-20 NOTE — Telephone Encounter (Signed)
Pt states he has a possible infection in his jaw.  Pt cannot sleep at night. He thinks it started in his gum line, but has gone down into the jaw. It is now swollen. Pt wants to know if he can be seen any sooner than 2:15 pm tomorrow, which was the first thing available.  Pt prefers to see you, and not a dentist because he is not sure this is a dental issue.

## 2015-05-20 NOTE — Telephone Encounter (Signed)
Can use same day in AM (11:15) then change his current appointment slot to same day. We also have 2 physicians with extended hours today he could see- but I saw preference to see me

## 2015-05-20 NOTE — Telephone Encounter (Signed)
Left message for pt to be here at 11 am Friday. If he needs appointment today, he will need to see another provider.

## 2015-05-21 ENCOUNTER — Ambulatory Visit: Payer: Self-pay | Admitting: Family Medicine

## 2015-05-21 ENCOUNTER — Ambulatory Visit (INDEPENDENT_AMBULATORY_CARE_PROVIDER_SITE_OTHER): Payer: BLUE CROSS/BLUE SHIELD | Admitting: Family Medicine

## 2015-05-21 ENCOUNTER — Encounter: Payer: Self-pay | Admitting: Family Medicine

## 2015-05-21 VITALS — BP 112/82 | HR 85 | Temp 98.4°F | Ht 72.0 in | Wt 219.0 lb

## 2015-05-21 DIAGNOSIS — K047 Periapical abscess without sinus: Secondary | ICD-10-CM

## 2015-05-21 MED ORDER — TRAMADOL HCL 50 MG PO TABS: 50.0000 mg | ORAL_TABLET | Freq: Three times a day (TID) | ORAL | Status: DC | PRN

## 2015-05-21 MED ORDER — CLINDAMYCIN HCL 300 MG PO CAPS: 600.0000 mg | ORAL_CAPSULE | Freq: Three times a day (TID) | ORAL | Status: DC

## 2015-05-21 NOTE — Telephone Encounter (Signed)
PT will be here at 1115 am

## 2015-05-21 NOTE — Progress Notes (Signed)
Subjective:  Mike Edwards is a [age] y.o. year old very pleasant male patient who presents for/with See problem oriented charting ROS- no fever, chills, nausea, vomiting. No trouble swallowing. .see any ROS included in HPI as well.   Past Medical History-  Patient Active Problem List   Diagnosis Date Noted  . Numbness and tingling of right arm 02/23/2014    Priority: Medium  . GERD (gastroesophageal reflux disease)     Priority: Medium  . Chronic cough 12/22/2014  . History of colonic polyps 12/02/2014    Medications- reviewed and updated Current Outpatient Prescriptions  Medication Sig Dispense Refill  . albuterol (PROVENTIL HFA;VENTOLIN HFA) 108 (90 BASE) MCG/ACT inhaler Inhale 2 puffs into the lungs every 6 (six) hours as needed for wheezing or shortness of breath. 1 Inhaler 0  . famotidine (PEPCID) 20 MG tablet One at bedtime 30 tablet 2  . omeprazole (PRILOSEC OTC) 20 MG tablet Take 20 mg by mouth 2 (two) times daily.     No current facility-administered medications for this visit.    Objective: BP 112/82 mmHg  Pulse 85  Temp(Src) 98.4 F (36.9 C) (Oral)  Ht 6' (1.829 m)  Wt 219 lb (99.338 kg)  BMI 29.70 kg/m2 Gen: NAD, resting comfortably No lymphadenopathy cervical or submandibular Mouth: on left lower side of teeth patient has erythema near base of tooth as well as whitish linear abrasion. Externally there is a swollen area about 2 x 2 cm somewhat fluctuant concerning for abscess- and this is noted at base of gum as well.  CV: RRR no murmurs rubs or gallops Lungs: CTAB no crackles, wheeze, rhonchi Ext: no edema Skin: warm, dry, no warmth or erythema around jaw.  Neuro: grossly normal, moves all extremities  Assessment/Plan:  Dental abscess S: started with pain beneath left lower side beneath tooth that had 3 root canals previously. Started 3 weeks ago then got better then got worse and has continued to worsen until last day when slightly better. Last few  nights have been difficult to sleep. Admits to poor hygiene on that tooth. Severe pain. No trouble swallowing. Taking aspirin for pain. He knows he should be at dentist but does not have dental insurance so decided to come here A/P: I am worried about dental abscess. He does have some facial swelling but none submandibularly so doubt ludwig angina potential. Will start on clindamycin 600mg  TID to coverand strongly advised dentist visit within next week as may need i+D or extraction of tooth. Sparing tramadol for pain so he will seek care with dentist if pain remains through weekend.   Strict Return precautions advised.   Meds ordered this encounter  Medications  . clindamycin (CLEOCIN) 300 MG capsule    Sig: Take 2 capsules (600 mg total) by mouth 3 (three) times daily.    Dispense:  42 capsule    Refill:  0  . traMADol (ULTRAM) 50 MG tablet    Sig: Take 1 tablet (50 mg total) by mouth every 8 (eight) hours as needed.    Dispense:  10 tablet    Refill:  0  New acute issue with Medication management and extensive counseling on follow up needs- high risk potential abscess that likely needs i+d which i cannot provide  Tana ConchStephen Hunter, MD

## 2015-05-21 NOTE — Patient Instructions (Signed)
Dental infection  Treat with clindamycin for 7 days  Tramadol sparingly for pain- If you are running out of pain medicine and continue to have high level pain- means you need to see dentist ASAP  Encourage you to go ahead and schedule dental follow up for next week given swelling on face and potential for abscess. Ideally I would encourage you to see them ASAP but I understand your cost concern- try for next Friday at latest  If fever, expanding redness or swelling- need to seek care immediately with emergency room as likely option as may need IV antibiotics and incision

## 2015-05-21 NOTE — Progress Notes (Signed)
Pre visit review using our clinic review tool, if applicable. No additional management support is needed unless otherwise documented below in the visit note. 

## 2015-08-25 ENCOUNTER — Other Ambulatory Visit: Payer: Self-pay | Admitting: Family Medicine

## 2015-08-25 ENCOUNTER — Encounter: Payer: Self-pay | Admitting: Family Medicine

## 2015-08-25 ENCOUNTER — Ambulatory Visit (INDEPENDENT_AMBULATORY_CARE_PROVIDER_SITE_OTHER): Payer: BLUE CROSS/BLUE SHIELD | Admitting: Family Medicine

## 2015-08-25 VITALS — BP 132/88 | HR 64 | Temp 98.4°F | Ht 71.25 in | Wt 217.2 lb

## 2015-08-25 DIAGNOSIS — Z7289 Other problems related to lifestyle: Secondary | ICD-10-CM

## 2015-08-25 DIAGNOSIS — IMO0001 Reserved for inherently not codable concepts without codable children: Secondary | ICD-10-CM

## 2015-08-25 DIAGNOSIS — R6889 Other general symptoms and signs: Secondary | ICD-10-CM | POA: Diagnosis not present

## 2015-08-25 DIAGNOSIS — Z0001 Encounter for general adult medical examination with abnormal findings: Secondary | ICD-10-CM

## 2015-08-25 DIAGNOSIS — R030 Elevated blood-pressure reading, without diagnosis of hypertension: Secondary | ICD-10-CM

## 2015-08-25 LAB — COMPREHENSIVE METABOLIC PANEL
ALT: 55 U/L — AB (ref 0–53)
AST: 29 U/L (ref 0–37)
Albumin: 4.3 g/dL (ref 3.5–5.2)
Alkaline Phosphatase: 85 U/L (ref 39–117)
BILIRUBIN TOTAL: 0.4 mg/dL (ref 0.2–1.2)
BUN: 24 mg/dL — AB (ref 6–23)
CHLORIDE: 108 meq/L (ref 96–112)
CO2: 25 meq/L (ref 19–32)
CREATININE: 1.1 mg/dL (ref 0.40–1.50)
Calcium: 8.8 mg/dL (ref 8.4–10.5)
GFR: 73.28 mL/min (ref >60.00)
GLUCOSE: 123 mg/dL — AB (ref 70–99)
Potassium: 4.4 mEq/L (ref 3.5–5.1)
SODIUM: 140 meq/L (ref 135–145)
Total Protein: 6.7 g/dL (ref 6.0–8.3)

## 2015-08-25 LAB — POC URINALSYSI DIPSTICK (AUTOMATED)
Bilirubin, UA: NEGATIVE
Glucose, UA: NEGATIVE
KETONES UA: NEGATIVE
Leukocytes, UA: NEGATIVE
Nitrite, UA: NEGATIVE
RBC UA: NEGATIVE
Urobilinogen, UA: 0.2
pH, UA: 5

## 2015-08-25 LAB — CBC
HCT: 43.8 % (ref 39.0–52.0)
Hemoglobin: 15.4 g/dL (ref 13.0–17.0)
MCHC: 35.2 g/dL (ref 30.0–36.0)
MCV: 87.8 fl (ref 78.0–100.0)
Platelets: 303 10*3/uL (ref 150.0–400.0)
RBC: 4.99 Mil/uL (ref 4.22–5.81)
RDW: 13.4 % (ref 11.5–15.5)
WBC: 5.9 10*3/uL (ref 4.0–10.5)

## 2015-08-25 LAB — LIPID PANEL
CHOL/HDL RATIO: 4
Cholesterol: 271 mg/dL — ABNORMAL HIGH (ref 0–200)
HDL: 62.7 mg/dL (ref >39.00)
LDL CALC: 192 mg/dL — AB (ref 0–99)
NONHDL: 208.48
TRIGLYCERIDES: 83 mg/dL (ref 0.0–149.0)
VLDL: 16.6 mg/dL (ref 0.0–40.0)

## 2015-08-25 LAB — PSA: PSA: 0.65 ng/mL (ref 0.10–4.00)

## 2015-08-25 NOTE — Patient Instructions (Addendum)
Labs before you go  Would love to have you exercising but I know its tough with your schedule

## 2015-08-25 NOTE — Progress Notes (Signed)
Pre visit review using our clinic review tool, if applicable. No additional management support is needed unless otherwise documented below in the visit note. 

## 2015-08-25 NOTE — Progress Notes (Signed)
Phone: [phone]  Subjective:  Patient presents today for their annual physical. Chief complaint-noted.   See problem oriented charting- ROS- full  review of systems was completed and negative except for: occasional reflux symptoms controlled with PPI  The following were reviewed and entered/updated in epic: Past Medical History:  Diagnosis Date  . GERD (gastroesophageal reflux disease)    Patient Active Problem List   Diagnosis Date Noted  . Numbness and tingling of right arm 02/23/2014    Priority: Medium  . GERD (gastroesophageal reflux disease)     Priority: Medium  . Chronic cough 12/22/2014  . History of colonic polyps 12/02/2014   Past Surgical History:  Procedure Laterality Date  . none      Family History  Problem Relation Age of Onset  . Esophageal cancer Father     father-2 pack a day smoker, death within 9 months of diagnosis  . Diabetes Maternal Grandfather     Medications- reviewed and updated Current Outpatient Prescriptions  Medication Sig Dispense Refill  . omeprazole (PRILOSEC OTC) 20 MG tablet Take 20 mg by mouth daily as needed.     Marland Kitchen. albuterol (PROVENTIL HFA;VENTOLIN HFA) 108 (90 BASE) MCG/ACT inhaler Inhale 2 puffs into the lungs every 6 (six) hours as needed for wheezing or shortness of breath. (Patient not taking: Reported on 08/25/2015) 1 Inhaler 0   No current facility-administered medications for this visit.     Allergies-reviewed and updated No Known Allergies  Social History   Social History  . Marital status: Married    Spouse name: N/A  . Number of children: N/A  . 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 Smoking status: Never Smoker  . Smokeless tobacco: Never Used  . Alcohol use No  . Drug use: No  . Sexual activity: Not Asked   Other Topics Concern  . None   Social History Narrative   Married. 4 children (3 live in area, 1 in [place]). No grandkids. Many  Granddogs.       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    [employer] most recently.       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 Oropharynx normal Neck: no thyromegaly CV: RRR no murmurs rubs or gallops Lungs: CTAB no crackles, wheeze, rhonchi Abdomen: soft/nontender/nondistended/normal bowel sounds. No rebound or guarding.  Rectal: normal tone, normal sized prostate, no masses or tenderness Ext: no edema Skin: warm, dry Neuro: grossly normal, moves all extremities, PERRLA  Assessment/Plan:  55 y.o. male presenting for annual physical.  Health Maintenance counseling: 1. Anticipatory guidance: Patient counseled regarding regular dental exams, eye exams (no visit in 2-3 years, planning to update visit), wearing seatbelts.  2. Risk factor reduction:  Advised patient of need for regular exercise (not there- busy with work and stressed) and diet rich and fruits and vegetables to reduce risk of heart attack and stroke.  3. Immunizations/screenings/ancillary studies- declines flu shot Immunization History  Administered Date(s) Administered  . Tdap 03/19/2014   Health Maintenance Due  Topic Date Due  .  Hepatitis C Screening - advised, patient will update this 04/18/1960   4. Prostate cancer screening- need updated PSA, low risk based off of rectal today   Lab Results  Component Value Date   PSA 0.66 03/19/2014   5. Colon cancer screening - 12/01/14 with 5 year follow up due to adenoma  Status of chronic or acute concerns  Suspected cervical arthritis- right arm numbness/tingling. Had declined x-ray in past due to costbut we trialed prednisone course which was  beneficial- cervical arthritis as likely cause. No issues recently.   GERD- normal EGD 12/01/14. Now just using prilosec every other day  Dental abscess prior- getting extraction and now dental implant. Has issue on the left side now as well.   Chronic cough- has seen pulmonology diangnosed with upper airway cough syndrome- plan was to maximize PPI then readdress with methacholine challenge while on GERD but patinent never returned to pulmonology after visit last December. He states the cough is better. Not using inhaler.   Watch BP- elevated initially, improved on repeat. No recent prior elevatoins.   Return in about 1 year (around 08/24/2016) for physical.  Orders Placed This Encounter  Procedures  . CBC    [place]  . Comprehensive metabolic panel    [place]    Order Specific Question:   Has the patient fasted?    Answer:   No  . Lipid panel    [place]    Order Specific Question:   Has the patient fasted?    Answer:   No  . PSA  . Hepatitis C antibody, reflex    [facility]  . POCT Urinalysis Dipstick (Automated)   Return precautions advised.   Tana ConchStephen Hunter, MD

## 2015-08-26 LAB — HEPATITIS C ANTIBODY: HCV Ab: NEGATIVE

## 2016-03-01 ENCOUNTER — Ambulatory Visit: Payer: BLUE CROSS/BLUE SHIELD | Admitting: Family Medicine

## 2016-08-25 ENCOUNTER — Ambulatory Visit (INDEPENDENT_AMBULATORY_CARE_PROVIDER_SITE_OTHER): Payer: BLUE CROSS/BLUE SHIELD | Admitting: Family Medicine

## 2016-08-25 ENCOUNTER — Encounter: Payer: Self-pay | Admitting: Family Medicine

## 2016-08-25 VITALS — BP 134/92 | HR 78 | Temp 97.9°F | Ht 73.0 in | Wt 222.0 lb

## 2016-08-25 DIAGNOSIS — Z0000 Encounter for general adult medical examination without abnormal findings: Secondary | ICD-10-CM

## 2016-08-25 DIAGNOSIS — Z125 Encounter for screening for malignant neoplasm of prostate: Secondary | ICD-10-CM

## 2016-08-25 DIAGNOSIS — R739 Hyperglycemia, unspecified: Secondary | ICD-10-CM | POA: Diagnosis not present

## 2016-08-25 DIAGNOSIS — E785 Hyperlipidemia, unspecified: Secondary | ICD-10-CM

## 2016-08-25 LAB — COMPREHENSIVE METABOLIC PANEL
ALT: 24 U/L (ref 0–53)
AST: 18 U/L (ref 0–37)
Albumin: 4.3 g/dL (ref 3.5–5.2)
Alkaline Phosphatase: 73 U/L (ref 39–117)
BUN: 24 mg/dL — AB (ref 6–23)
CHLORIDE: 107 meq/L (ref 96–112)
CO2: 26 mEq/L (ref 19–32)
CREATININE: 1.01 mg/dL (ref 0.40–1.50)
Calcium: 9.4 mg/dL (ref 8.4–10.5)
GFR: 80.58 mL/min (ref >60.00)
Glucose, Bld: 120 mg/dL — ABNORMAL HIGH (ref 70–99)
POTASSIUM: 4.6 meq/L (ref 3.5–5.1)
Sodium: 140 mEq/L (ref 135–145)
TOTAL PROTEIN: 7 g/dL (ref 6.0–8.3)
Total Bilirubin: 0.4 mg/dL (ref 0.2–1.2)

## 2016-08-25 LAB — POC URINALSYSI DIPSTICK (AUTOMATED)
Bilirubin, UA: NEGATIVE
Blood, UA: NEGATIVE
Clarity, UA: NEGATIVE
Glucose, UA: NEGATIVE
KETONES UA: NEGATIVE
Leukocytes, UA: NEGATIVE
Nitrite, UA: NEGATIVE
PH UA: 6 (ref 5.0–8.0)
PROTEIN UA: NEGATIVE
Urobilinogen, UA: 0.2 E.U./dL

## 2016-08-25 LAB — CBC
HCT: 47.2 % (ref 39.0–52.0)
Hemoglobin: 16 g/dL (ref 13.0–17.0)
MCHC: 33.9 g/dL (ref 30.0–36.0)
MCV: 91.5 fl (ref 78.0–100.0)
Platelets: 316 10*3/uL (ref 150.0–400.0)
RBC: 5.16 Mil/uL (ref 4.22–5.81)
RDW: 13.1 % (ref 11.5–15.5)
WBC: 6.3 10*3/uL (ref 4.0–10.5)

## 2016-08-25 LAB — LIPID PANEL
Cholesterol: 253 mg/dL — ABNORMAL HIGH (ref 0–200)
HDL: 57.9 mg/dL (ref >39.00)
LDL CALC: 176 mg/dL — AB (ref 0–99)
NONHDL: 195.34
Total CHOL/HDL Ratio: 4
Triglycerides: 99 mg/dL (ref 0.0–149.0)
VLDL: 19.8 mg/dL (ref 0.0–40.0)

## 2016-08-25 LAB — HEMOGLOBIN A1C: Hgb A1c MFr Bld: 5.5 % (ref 4.6–6.5)

## 2016-08-25 LAB — PSA: PSA: 0.46 ng/mL (ref 0.10–4.00)

## 2016-08-25 NOTE — Patient Instructions (Addendum)
Please stop by lab before you go  Glad apple cider vinegar is helping  Lets set a goal of at least 20 lbs down Wt Readings from Last 3 Encounters:  08/25/16 222 lb (100.7 kg)  08/25/15 217 lb 3.2 oz (98.5 kg)  05/21/15 219 lb (99.3 kg)   Your blood pressure is slightly high, blood sugar has been high, and cholesterol is far too high (likely to recommend medicine for this- and likely more genetic than even lifestyle related). Would advise a 6 month check in to recheck the above

## 2016-08-25 NOTE — Progress Notes (Signed)
Phone: [phone]  Subjective:  Patient presents today for their annual physical. Chief complaint-noted.   See problem oriented charting- ROS- full  review of systems was completed and negative except for: sinus congestion- makes it difficult to breath through nose  The following were reviewed and entered/updated in epic: Past Medical History:  Diagnosis Date  . GERD (gastroesophageal reflux disease)    Patient Active Problem List   Diagnosis Date Noted  . Hyperlipidemia 08/25/2016    Priority: Medium  . Numbness and tingling of right arm 02/23/2014    Priority: Medium  . GERD (gastroesophageal reflux disease)     Priority: Medium  . Hyperglycemia 08/25/2016  . Chronic cough 12/22/2014  . History of colonic polyps 12/02/2014   Past Surgical History:  Procedure Laterality Date  . none      Family History  Problem Relation Age of Onset  . Esophageal cancer Father        father-2 pack a day smoker, death within 9 months of diagnosis  . Diabetes Maternal Grandfather     Medications- reviewed and updated Current Outpatient Prescriptions  Medication Sig Dispense Refill  . albuterol (PROVENTIL HFA;VENTOLIN HFA) 108 (90 BASE) MCG/ACT inhaler Inhale 2 puffs into the lungs every 6 (six) hours as needed for wheezing or shortness of breath. 1 Inhaler 0  . omeprazole (PRILOSEC OTC) 20 MG tablet Take 20 mg by mouth daily as needed.      No current facility-administered medications for this visit.     Allergies-reviewed and updated No Known Allergies  Social History   Social History  . Marital status: Married    Spouse name: N/A  . Number of children: N/A  . 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 Smoking status: Never Smoker  . Smokeless tobacco: Never Used  . Alcohol use No  . Drug use: No  . Sexual activity: Not Asked   Other Topics Concern  . None   Social History Narrative   Married. 4  children (3 live in area, 1 in [place]). No grandkids. Many Granddogs.       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    [employer] most recently.       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 Oropharynx normal. Some clear congestion in nares Neck: no thyromegaly CV: RRR no murmurs rubs or gallops Lungs: CTAB no crackles, wheeze, rhonchi Abdomen: soft/nontender/nondistended/normal bowel sounds. No rebound or guarding.  Ext: no edema Skin: warm, dry Neuro: grossly normal, moves all extremities, PERRLA Rectal: normal tone, normal sized prostate, no masses or tenderness  Assessment/Plan:  56 y.o. male presenting for annual physical.  Health Maintenance counseling: 1. Anticipatory guidance: Patient counseled regarding regular dental exams - has been a long time since he has been seen- due to expense, eye exams - still has not been seen- last year advised follow up as last seen 2-3 years ago, wearing seatbelts.  2. Risk factor reduction:  Advised patient of need for regular exercise and diet rich and fruits and vegetables to reduce risk  of heart attack and stroke. Exercise- 150 minutes a week recommended. Diet-water and coffee to drink, portion size needs to work .  Wt Readings from Last 3 Encounters:  08/25/16 222 lb (100.7 kg)  08/25/15 217 lb 3.2 oz (98.5 kg)  05/21/15 219 lb (99.3 kg)  3. Immunizations/screenings/ancillary studies- advised shingrix and flu shot (declines).  Immunization History  Administered Date(s) Administered  . Tdap 03/19/2014  4. Prostate cancer screening- update psa, low risk psa trend.  Lab Results  Component Value Date   PSA 0.46 08/25/2016     PSA 0.65 08/25/2015   PSA 0.66 03/19/2014   5. Colon cancer screening - 01/30/14 with 5 year follow up  Status of chronic or acute concerns   Cervical arthritis issues in past that responded to prednisone- no current issues  GERD- prilosec every day in the past- apple cider vinegar instead has been effective. Normal EGD 12/01/14.   Chronic cough- has seen pulmonary in past- thought to be upper airway cough syndrome. Plan has been to maximize PPI then readdress methacholine challenge on GERD therapies. Had inhaler on hand but was not using. Has not seen pulm back. States issues have resolved. Other than Sinus congestion for 2 weeks- has not taken any medicine like antihistamine  Hyperglycemia- update a1c and cbg- advised weight loss  Watch BP- does not carry hypertension diagnosis. Work on weight loss- if persistently high in 6months would diagnose at that time.  BP Readings from Last 3 Encounters:  08/25/16 (!) 134/92  08/25/15 132/88  05/21/15 112/82    Hyperlipidemia Hyperlipidemia- advised weight loss last year as so close to diabetes. Update today- at this point would start statin if LDL above 190 or risk per ascvd >12%. Risk came back under 9%- work on weight loss Lab Results  Component Value Date   CHOL 253 (H) 08/25/2016   HDL 57.90 08/25/2016   LDLCALC 176 (H) 08/25/2016   TRIG 99.0 08/25/2016   CHOLHDL 4 08/25/2016     Hyperglycemia See result note from today. CBG high and a1c not elevated. Lab Results  Component Value Date   HGBA1C 5.5 08/25/2016   6 months BP and weight check  Orders Placed This Encounter  Procedures  . PSA  . CBC    [place]  . Comprehensive metabolic panel    [place]    Order Specific Question:   Has the patient fasted?    Answer:   No  . Lipid panel    [place]    Order Specific Question:   Has the patient fasted?    Answer:   No  . Hemoglobin A1c    [place]  . POCT Urinalysis Dipstick (Automated)   Return precautions advised.   Tana Conch, MD

## 2016-08-25 NOTE — Assessment & Plan Note (Signed)
Hyperlipidemia- advised weight loss last year as so close to diabetes. Update today- at this point would start statin if LDL above 190 or risk per ascvd >12%. Risk came back under 9%- work on weight loss Lab Results  Component Value Date   CHOL 253 (H) 08/25/2016   HDL 57.90 08/25/2016   LDLCALC 176 (H) 08/25/2016   TRIG 99.0 08/25/2016   CHOLHDL 4 08/25/2016

## 2016-08-25 NOTE — Assessment & Plan Note (Signed)
See result note from today. CBG high and a1c not elevated. Lab Results  Component Value Date   HGBA1C 5.5 08/25/2016

## 2017-06-13 IMAGING — DX DG CHEST 2V
2 series · 2 of 2 positions shown · non-contrast
Comparison: None.

CLINICAL DATA: Nonproductive cough and shortness of breath for 1
year.

EXAM:
CHEST  2 VIEW

[chest pa]
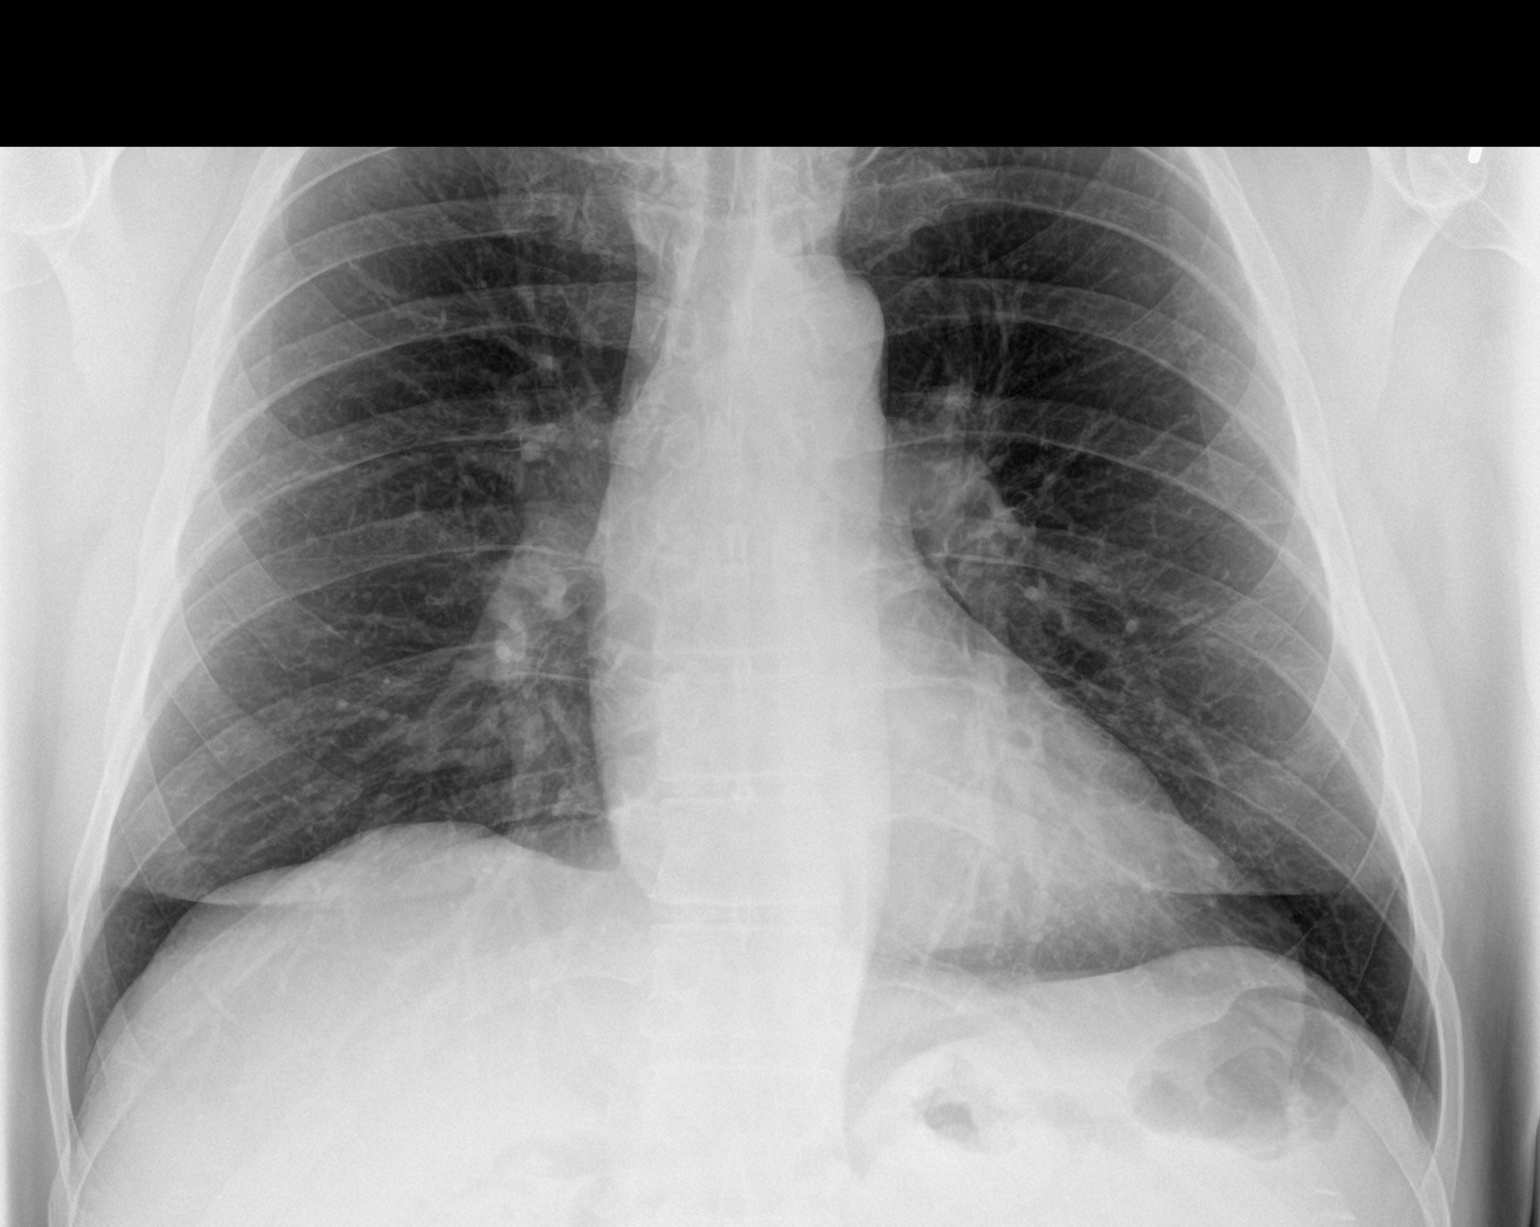

[chest lat]
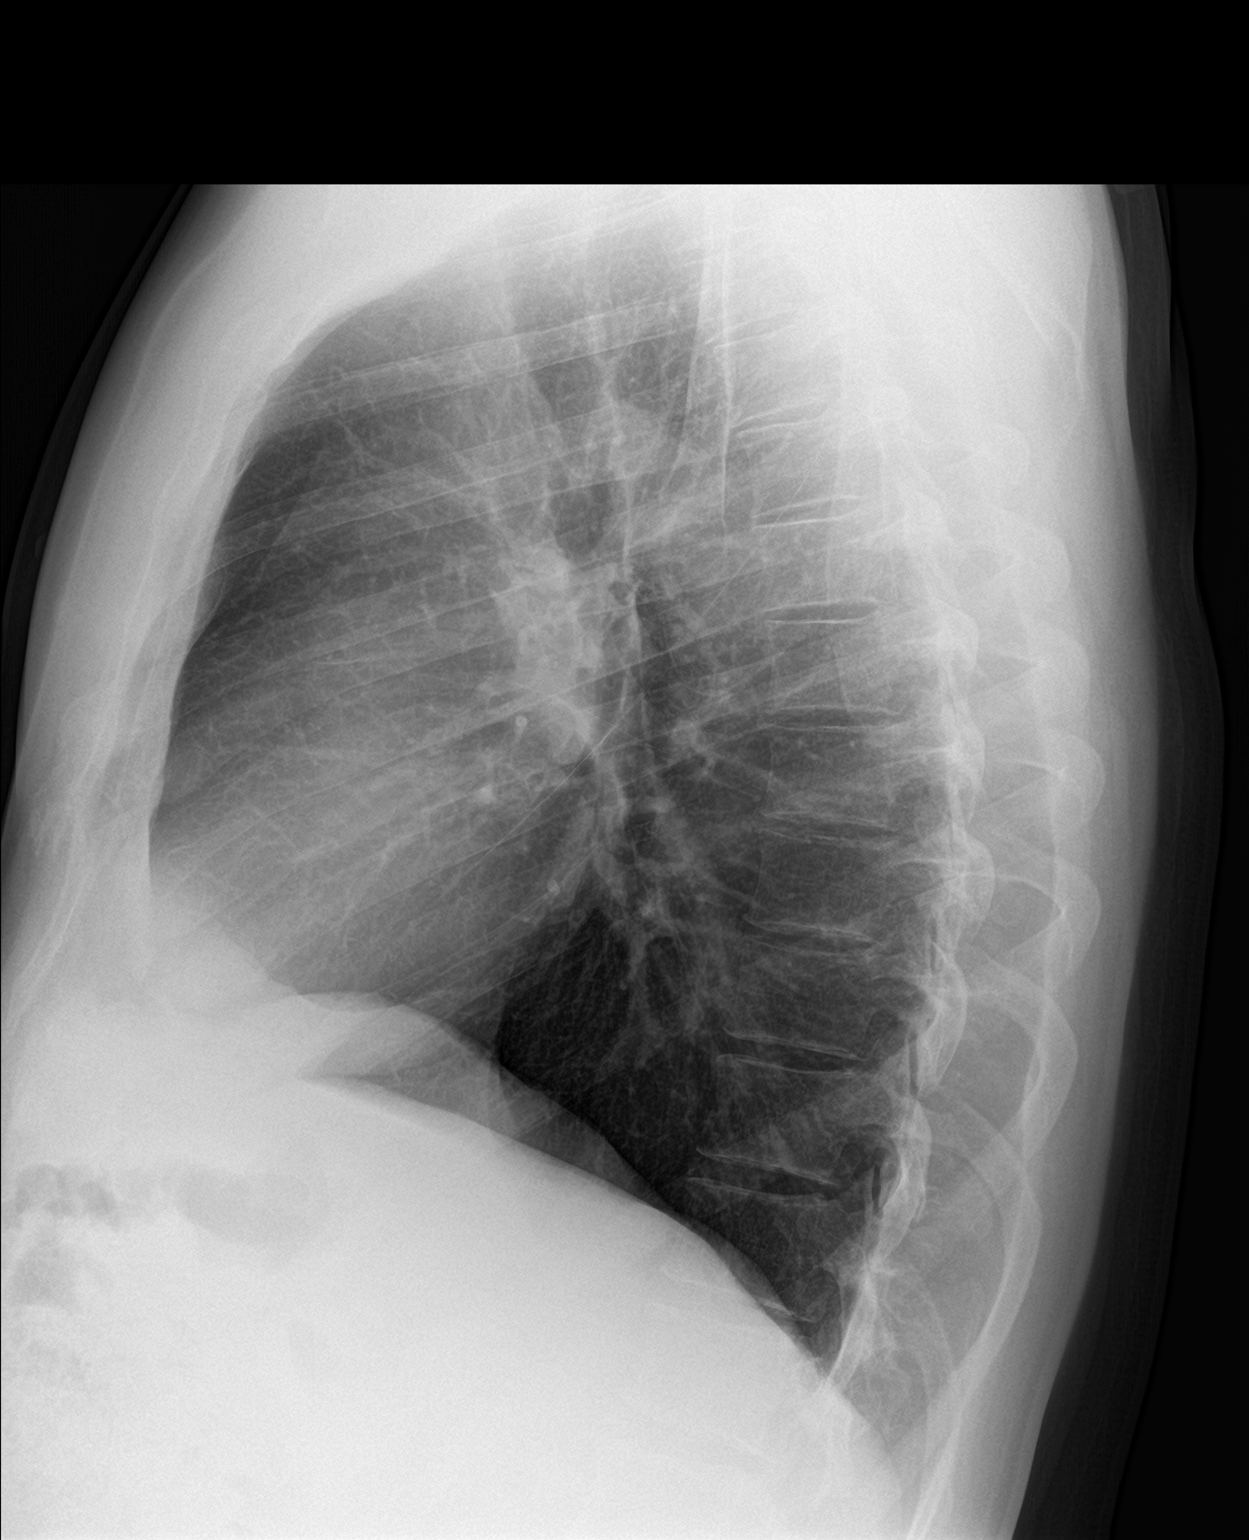

[2 of 2 positions shown; findings below may reference images not displayed]

FINDINGS: The heart size and mediastinal contours are within normal limits.
Both lungs are clear. The visualized skeletal structures are
unremarkable.
IMPRESSION: No active cardiopulmonary disease.

## 2017-12-10 ENCOUNTER — Encounter: Payer: Self-pay | Admitting: Family Medicine

## 2017-12-10 ENCOUNTER — Ambulatory Visit (INDEPENDENT_AMBULATORY_CARE_PROVIDER_SITE_OTHER): Payer: BLUE CROSS/BLUE SHIELD | Admitting: Family Medicine

## 2017-12-10 VITALS — BP 128/88 | HR 60 | Temp 98.1°F | Ht 73.0 in | Wt 218.2 lb

## 2017-12-10 DIAGNOSIS — Z125 Encounter for screening for malignant neoplasm of prostate: Secondary | ICD-10-CM | POA: Diagnosis not present

## 2017-12-10 DIAGNOSIS — Z0000 Encounter for general adult medical examination without abnormal findings: Secondary | ICD-10-CM | POA: Diagnosis not present

## 2017-12-10 DIAGNOSIS — R739 Hyperglycemia, unspecified: Secondary | ICD-10-CM

## 2017-12-10 DIAGNOSIS — E785 Hyperlipidemia, unspecified: Secondary | ICD-10-CM

## 2017-12-10 LAB — COMPREHENSIVE METABOLIC PANEL
ALT: 41 U/L (ref 0–53)
AST: 28 U/L (ref 0–37)
Albumin: 4.6 g/dL (ref 3.5–5.2)
Alkaline Phosphatase: 89 U/L (ref 39–117)
BUN: 22 mg/dL (ref 6–23)
CALCIUM: 9.7 mg/dL (ref 8.4–10.5)
CO2: 26 meq/L (ref 19–32)
CREATININE: 1 mg/dL (ref 0.40–1.50)
Chloride: 101 mEq/L (ref 96–112)
GFR: 81.15 mL/min (ref >60.00)
Glucose, Bld: 109 mg/dL — ABNORMAL HIGH (ref 70–99)
Potassium: 4.3 mEq/L (ref 3.5–5.1)
SODIUM: 137 meq/L (ref 135–145)
Total Bilirubin: 0.6 mg/dL (ref 0.2–1.2)
Total Protein: 7.4 g/dL (ref 6.0–8.3)

## 2017-12-10 LAB — LIPID PANEL
CHOL/HDL RATIO: 4
Cholesterol: 266 mg/dL — ABNORMAL HIGH (ref 0–200)
HDL: 68.5 mg/dL (ref >39.00)
LDL Cholesterol: 176 mg/dL — ABNORMAL HIGH (ref 0–99)
NonHDL: 197.98
Triglycerides: 109 mg/dL (ref 0.0–149.0)
VLDL: 21.8 mg/dL (ref 0.0–40.0)

## 2017-12-10 LAB — PSA: PSA: 0.58 ng/mL (ref 0.10–4.00)

## 2017-12-10 NOTE — Progress Notes (Signed)
Phone: [phone]  Subjective:  Patient presents today for their annual physical. Chief complaint-noted.   See problem oriented charting- ROS- full  review of systems was completed and negative except for: difficulty urinating and some left foot pain per full ros sheet. No chest pain or shortness of breath with exercise/exertion.   The following were reviewed and entered/updated in epic: Past Medical History:  Diagnosis Date  . GERD (gastroesophageal reflux disease)    Patient Active Problem List   Diagnosis Date Noted  . Hyperlipidemia 08/25/2016    Priority: Medium  . Hyperglycemia 08/25/2016    Priority: Medium  . Numbness and tingling of right arm 02/23/2014    Priority: Medium  . GERD (gastroesophageal reflux disease)     Priority: Medium  . Chronic cough 12/22/2014    Priority: Low  . History of colonic polyps 12/02/2014    Priority: Low   Past Surgical History:  Procedure Laterality Date  . none      Family History  Problem Relation Age of Onset  . Esophageal cancer Father        father-2 pack a day smoker, death within 9 months of diagnosis  . Diabetes Maternal Grandfather     Medications- reviewed and updated No current outpatient medications on file.   No current facility-administered medications for this visit.     Allergies-reviewed and updated No Known Allergies  Social History   Social History Narrative   Married. 4 children (3 live in area, 1 in [place]). No grandkids. Many Granddogs.       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    [employer] most recently.       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 Oropharynx  normal Neck: no thyromegaly CV: RRR no murmurs rubs or gallops Lungs: CTAB no crackles, wheeze, rhonchi Abdomen: soft/nontender/nondistended/normal bowel sounds. No rebound or guarding.  Ext: no edema Skin: warm, dry Neuro: grossly normal, moves all extremities, PERRLA Msk: significant pain over left 1st MTP joint  Assessment/Plan:  57 y.o. male presenting for annual physical.  Health Maintenance counseling: 1. Anticipatory guidance: Patient counseled regarding regular dental exams -q6 months (not going but luckily no issues recently- doesn't have $ for his 10k repair), eye exams -yearly advied- he declines due to cost,  avoiding smoking and second hand smoke, limiting alcohol to 2 beverages per day .   2. Risk factor reduction:  Advised patient of need for regular exercise and diet rich and fruits and vegetables to reduce risk of heart attack and stroke. Exercise- not exercising- encouraged to start- particularly cardio. Diet-down 4 pounds from last year- he states has not been eating the best but may be due to activity level with work.  Wt Readings from Last 3 Encounters:  12/10/17 218 lb 3.2 oz (99 kg)  08/25/16 222 lb (100.7 kg)  08/25/15 217 lb 3.2 oz (98.5 kg)  3. Immunizations/screenings/ancillary studies-declines flu shot.   Immunization History  Administered Date(s) Administered  . Tdap 03/19/2014  4. Prostate cancer screening- low risk rectal exam in the past.  Low risk PSA trend-trend PSA only for now  unless curve bends up more significantly. A month ago didn't have the best ability to start stream- that resolved after a week. Nocturia 3x a day.  Lab Results  Component Value Date   PSA 0.46 08/25/2016   PSA 0.65 08/25/2015   PSA 0.66 03/19/2014   5. Colon cancer screening -01/30/2014 with 3-year follow-up planned- he is scheduled with Dr. Elnoria Howard 6. Skin cancer screening- no dermatologist. advised regular sunscreen use. Denies worrisome, changing, or new skin lesions.  7.   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   Elevated BP- very high normal diastolic- has had elevation at last years CPE to 40- will continue to monitor and discussed healthy eating, regular exercise, low salt/low eating out diet to try to help prevent progression to hyeprtension- he really doesn't want to take any medicine. He states already-->Eats at home mainly. Low salt diet. Doesn't exercise.   Hyperlipidemia-last year's lipids were elevated at 253 and LDL up to 176-10-year risk was still only 9%.  We will update lipid panel in 10-year risk and consider statin if needed   MSK: Some left foot pain from an old golfing injury with golf cart- hurts at the end of the day. 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 Still with plantar fasciitis- works with new shoes and inserts. 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  Typically responds well to prednisone.  No recent issues  Mild ED issues that come and go. Discussed possible sildenafil but he is concerned about cost- he may reach out to me about this.   GERD- patient doing well on apple cider vinegar instead of Prilosec.  Normal EGD 12/01/2014. Even has weaned down apple cidar vinegar with minimal issues  Chronic cough- Fortunately patient states symptoms have remained resolved over last 2 CPE- see prior notes if recurs  Hyperglycemia-A1c was not elevated last year or so we will get fasting CBG only plus to save cost  Advised 1 year CPE   Lab/Order associations:fasting  Preventative health care - Plan: Lipid panel, Comprehensive metabolic panel, PSA  Hyperlipidemia, unspecified hyperlipidemia type - Plan: Lipid panel, Comprehensive metabolic panel  Screening for prostate cancer - Plan: PSA  Hyperglycemia  Return precautions advised.  Tana Conch, MD

## 2017-12-10 NOTE — Patient Instructions (Addendum)
Please stop by lab before you go  Lets work on perhaps 5-10 lbs weight loss over next year- would love as part of this for you to target working out 150 minutes a week outside of work with some form of cardio that you enjoy- swimming is a great idea  Great to see you today!

## 2017-12-11 ENCOUNTER — Encounter: Payer: Self-pay | Admitting: Family Medicine

## 2017-12-17 ENCOUNTER — Telehealth: Payer: Self-pay | Admitting: Family Medicine

## 2017-12-17 NOTE — Telephone Encounter (Signed)
I have emailed charge corrections and billing to see if they can assist. Awaiting response.

## 2017-12-17 NOTE — Telephone Encounter (Signed)
-----   Message from Juliette AlcideHailey N Small sent at 12/10/2017 12:58 PM EST ----- Regarding: Billing concerns for lab [facility] and stated for his last CPE he had gotten several bills with small payments (I.e. $8). Pt stated he was billed through "[facility]" and had spoke with them as well as his insurance. Pt stated his insurance covers 100% of his cpe including labs. Pt wanted to make sure this doesn't happen for this years CPE. Pt had [facility]pe with [facility] for this year on 12/10/17

## 2017-12-18 ENCOUNTER — Encounter: Payer: Self-pay | Admitting: Family Medicine

## 2017-12-18 LAB — HM COLONOSCOPY

## 2017-12-19 ENCOUNTER — Encounter: Payer: Self-pay | Admitting: Family Medicine

## 2017-12-19 MED ORDER — TADALAFIL 20 MG PO TABS: 20.0000 mg | ORAL_TABLET | ORAL | 1 refills | Status: DC | PRN

## 2017-12-20 ENCOUNTER — Other Ambulatory Visit: Payer: Self-pay

## 2017-12-20 MED ORDER — TADALAFIL 20 MG PO TABS: 20.0000 mg | ORAL_TABLET | ORAL | 1 refills | Status: DC | PRN

## 2017-12-25 ENCOUNTER — Encounter: Payer: Self-pay | Admitting: Family Medicine

## 2018-01-08 ENCOUNTER — Encounter: Payer: Self-pay | Admitting: Family Medicine

## 2018-12-12 ENCOUNTER — Encounter: Payer: BLUE CROSS/BLUE SHIELD | Admitting: Family Medicine

## 2020-12-22 ENCOUNTER — Encounter: Payer: Self-pay | Admitting: Family Medicine

## 2020-12-23 ENCOUNTER — Other Ambulatory Visit: Payer: Self-pay | Admitting: *Deleted

## 2020-12-23 MED ORDER — TADALAFIL 20 MG PO TABS: 20.0000 mg | ORAL_TABLET | ORAL | 1 refills | Status: AC | PRN

## 2020-12-23 NOTE — Progress Notes (Signed)
I sent this in for him

## 2021-06-01 ENCOUNTER — Encounter: Payer: Self-pay | Admitting: Family Medicine

## 2021-06-02 ENCOUNTER — Other Ambulatory Visit: Payer: Self-pay | Admitting: Family Medicine

## 2021-06-02 ENCOUNTER — Ambulatory Visit (INDEPENDENT_AMBULATORY_CARE_PROVIDER_SITE_OTHER): Payer: 59 | Admitting: Family Medicine

## 2021-06-02 ENCOUNTER — Encounter: Payer: Self-pay | Admitting: Family Medicine

## 2021-06-02 VITALS — BP 100/80 | HR 90 | Temp 98.5°F | Ht 73.0 in | Wt 236.6 lb

## 2021-06-02 DIAGNOSIS — E785 Hyperlipidemia, unspecified: Secondary | ICD-10-CM

## 2021-06-02 DIAGNOSIS — Z23 Encounter for immunization: Secondary | ICD-10-CM

## 2021-06-02 DIAGNOSIS — R739 Hyperglycemia, unspecified: Secondary | ICD-10-CM | POA: Diagnosis not present

## 2021-06-02 DIAGNOSIS — Z0000 Encounter for general adult medical examination without abnormal findings: Secondary | ICD-10-CM

## 2021-06-02 DIAGNOSIS — K219 Gastro-esophageal reflux disease without esophagitis: Secondary | ICD-10-CM | POA: Diagnosis not present

## 2021-06-02 DIAGNOSIS — Z125 Encounter for screening for malignant neoplasm of prostate: Secondary | ICD-10-CM

## 2021-06-02 LAB — CBC WITH DIFFERENTIAL/PLATELET
Basophils Absolute: 0 10*3/uL (ref 0.0–0.1)
Basophils Relative: 0.6 % (ref 0.0–3.0)
Eosinophils Absolute: 0.2 10*3/uL (ref 0.0–0.7)
Eosinophils Relative: 4.2 % (ref 0.0–5.0)
HCT: 45.5 % (ref 39.0–52.0)
Hemoglobin: 15.4 g/dL (ref 13.0–17.0)
Lymphocytes Relative: 25.3 % (ref 12.0–46.0)
Lymphs Abs: 1.5 10*3/uL (ref 0.7–4.0)
MCHC: 34 g/dL (ref 30.0–36.0)
MCV: 90.1 fl (ref 78.0–100.0)
Monocytes Absolute: 0.5 10*3/uL (ref 0.1–1.0)
Monocytes Relative: 8.7 % (ref 3.0–12.0)
Neutro Abs: 3.5 10*3/uL (ref 1.4–7.7)
Neutrophils Relative %: 61.2 % (ref 43.0–77.0)
Platelets: 283 10*3/uL (ref 150.0–400.0)
RBC: 5.05 Mil/uL (ref 4.22–5.81)
RDW: 13.3 % (ref 11.5–15.5)
WBC: 5.7 10*3/uL (ref 4.0–10.5)

## 2021-06-02 LAB — COMPREHENSIVE METABOLIC PANEL
ALT: 36 U/L (ref 0–53)
AST: 25 U/L (ref 0–37)
Albumin: 4.4 g/dL (ref 3.5–5.2)
Alkaline Phosphatase: 81 U/L (ref 39–117)
BUN: 22 mg/dL (ref 6–23)
CO2: 26 mEq/L (ref 19–32)
Calcium: 9.3 mg/dL (ref 8.4–10.5)
Chloride: 104 mEq/L (ref 96–112)
Creatinine, Ser: 1.18 mg/dL (ref 0.40–1.50)
GFR: 65.91 mL/min (ref >60.00)
Glucose, Bld: 97 mg/dL (ref 70–99)
Potassium: 4.4 mEq/L (ref 3.5–5.1)
Sodium: 140 mEq/L (ref 135–145)
Total Bilirubin: 0.7 mg/dL (ref 0.2–1.2)
Total Protein: 7 g/dL (ref 6.0–8.3)

## 2021-06-02 LAB — LIPID PANEL
Cholesterol: 257 mg/dL — ABNORMAL HIGH (ref 0–200)
HDL: 65 mg/dL (ref >39.00)
LDL Cholesterol: 174 mg/dL — ABNORMAL HIGH (ref 0–99)
NonHDL: 192.31
Total CHOL/HDL Ratio: 4
Triglycerides: 91 mg/dL (ref 0.0–149.0)
VLDL: 18.2 mg/dL (ref 0.0–40.0)

## 2021-06-02 LAB — PSA: PSA: 0.5 ng/mL (ref 0.10–4.00)

## 2021-06-02 LAB — HEMOGLOBIN A1C: Hgb A1c MFr Bld: 5.7 % (ref 4.6–6.5)

## 2021-06-02 MED ORDER — OMEPRAZOLE 40 MG PO CPDR: 40.0000 mg | DELAYED_RELEASE_CAPSULE | Freq: Every day | ORAL | 3 refills | Status: DC

## 2021-06-02 NOTE — Patient Instructions (Addendum)
Shingrix #1 today. Repeat injection in 2-5 months. Schedule a nurse visit for the 2nd injection before you leave today (at the check out desk)  Send [place] the date of your most recent covid shot- last we have on file is 10/02/19  - trial 40 mg omeprazole- if not better within a month or two would recommend going back to Dr. Benson Norway to see if endoscopy recommended  If you change mind on testicular ultrasound let us know- did not feel hernia on exam but could be coming and going  Please stop by lab before you go If you have mychart- we will send your results within 3 business days of [place] receiving them.  If you do not have mychart- we will call you about results within 5 business days of [place] receiving them.  *please also note that you will see labs on mychart as soon as they post. I will later go in and write notes on them- will say "notes from Dr. Yong Channel"   Recommended follow up: Return in about 1 year (around 06/03/2022) for physical or sooner if needed.Schedule b4 you leave.

## 2021-06-02 NOTE — Addendum Note (Signed)
Addended by: Clyde Lundborg A on: 06/02/2021 01:54 PM   Modules accepted: Orders

## 2021-06-02 NOTE — Progress Notes (Signed)
Phone: [phone]   Subjective:  Patient presents today for their annual physical and to reestablish care with last visit in 2019-most recent visit with [facility] 12/17/2019. Chief complaint-noted.   See problem oriented charting- ROS- full  review of systems was completed and negative  except for: testicular pain, shortness of breath- previously described as chronic cough  The following were reviewed and entered/updated in epic: Past Medical History:  Diagnosis Date   GERD (gastroesophageal reflux disease)    Patient Active Problem List   Diagnosis Date Noted   Hyperlipidemia 08/25/2016    Priority: Medium    Hyperglycemia 08/25/2016    Priority: Medium    Numbness and tingling of right arm 02/23/2014    Priority: Medium    GERD (gastroesophageal reflux disease)     Priority: Medium    Chronic cough 12/22/2014    Priority: Low   History of colonic polyps 12/02/2014    Priority: Low   Past Surgical History:  Procedure Laterality Date   none      Family History  Problem Relation Age of Onset   Esophageal cancer Father        father-2 pack a day smoker, death within 9 months of diagnosis   Diabetes Maternal Grandfather     Medications- reviewed and updated Current Outpatient Medications  Medication Sig Dispense Refill   omeprazole (PRILOSEC) 40 MG capsule Take 1 capsule (40 mg total) by mouth daily. 30 capsule 3   tadalafil (CIALIS) 20 MG tablet Take 1 tablet (20 mg total) by mouth every 3 (three) days as needed for erectile dysfunction. 90 tablet 1   No current facility-administered medications for this visit.    Allergies-reviewed and updated No Known Allergies  Social History   Social History Narrative   Married. 4 children (3 live in area, 1 in [place]). No grandkids. Many Granddogs.       Self Employeed.    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 Oropharynx normal Neck: no thyromegaly CV: RRR no murmurs rubs or gallops Lungs: CTAB no crackles, wheeze, rhonchi Abdomen: soft/nontender/nondistended/normal bowel sounds. No rebound or guarding.  Ext: no edema Skin: warm, dry Neuro: grossly normal, moves all extremities, PERRLA GU: testicular exam largely normal and no hernia- reports mild tenderness on left compared to right   Assessment and Plan  61 y.o. male presenting for annual physical.  Health Maintenance counseling: 1. Anticipatory guidance: Patient counseled regarding regular dental exams - advised q6 months (not doing), eye exams -yearly,  avoiding smoking and second hand smoke , limiting alcohol to 2 beverages per day-max 1 per day, no illicit drugs.   2. Risk factor reduction:  Advised patient of need for regular exercise and diet rich and fruits and vegetables to reduce risk of heart attack and stroke.  Exercise- not exercising- has exercise room- working a lot of hours and challenging and a lot of stress.  Diet/weight management-weight has slipped up since last visit 18 pounds- discussed some options to work on this.  Wt  Readings from Last 3 Encounters:  06/02/21 236 lb 9.6 oz (107.3 kg)  12/10/17 218 lb 3.2 oz (99 kg)  08/25/16 222 lb (100.7 kg)  3. Immunizations/screenings/ancillary studies-discussed Shingrix- start today, COVID-19- will send [place] dates Immunization History  Administered Date(s) Administered   PFIZER(Purple Top)SARS-COV-2 Vaccination 03/03/2019, 03/24/2019, 10/02/2019   Tdap 03/19/2014  4. Prostate cancer screening- low risk prior PSA trend-trend PSA with labs today. Has nocturia but intentionally drinking water when he wakes up.   Potential BPH symptoms with nocturia Lab Results  Component Value Date   PSA 0.58 12/10/2017   PSA 0.46 08/25/2016   PSA 0.65 08/25/2015   5. Colon cancer screening - follows with Dr. Benson Norway. 12/18/17 with 5 year repeat planned 6. Skin cancer screening-no dermatologist. advised regular sunscreen use. Denies worrisome, changing, or new skin lesions.  7. Smoking associated screening (lung cancer screening, AAA screen 65-75, UA)-never smoker 8. STD screening - only active with wife  Status of chronic or acute concerns   #Social update-at last visit in 2019 patient was renovating homes full-time- some big projects   #left testicular discomfort- 2021 when he saw [facility] some concern for hernia for groin pain-  he reports pain is really more in the testicle itself. Worse with stress or heavy lifting. Comes and goes. Mild dull ache when occurs. He states more of a scrotum issue but 100% of time is on the left- mild issues at the moment. No lumps on the testicle itself. 3 years of issues he reports and stable - recommended ultrasound of testicle as on exam he states mildly tender on left compared to right but when we discussed potential cost compared to how mild symptoms are he would like to hold off. No obvious hernia on exam.    #Shortness of breath- mild intermittent issues for years but he states overall improved. Prior listed as cough- he states was really more of a sensation for shortness of breath but overall improved- prior pulmonary workup and Gerd workup. No chest pain with this. States not exertional- can go upstairs no issues  #hyperlipidemia S: Medication: None Lab Results  Component Value Date   CHOL 266 (H) 12/10/2017   HDL 68.50 12/10/2017   LDLCALC 176 (H) 12/10/2017   TRIG 109.0 12/10/2017   CHOLHDL 4 12/10/2017   A/P: Elevated lipids in the past-update lipid panel and calculate ASCVD risk with new #s- with older #s  The 10-year ASCVD risk score (Arnett DK, et al., 2019)  is: 10.4% . Potentially interested in ct cardiac scoring- $100 cash pay  #Erectile dysfunction/BPH-  I am willing to refill tadalafil - he states takes more for ED. Sounds like some BPH issues- he is actually taking 10 mg daily- advised 5 mg daily as that is max daily dose. Refills already provided  #GERD-in past did well with apple cider vinegar instead of Prilosec but symptoms worsened- having to take prilosec 20 mg now- if misses dose has issues in the evening or if he lays down. Drinking water helps him -  EGD in 2016 -Prior chronic cough/sob issues better but still bothersome - trial 40 mg omeprazole- if not better within a month or two would recommend going back to Dr. Benson Norway to see if endoscopy recommended   # Hyperglycemia/insulin resistance/prediabetes S:  Medication: none Lab Results  Component Value Date   HGBA1C 5.5 08/25/2016   HGBA1C 5.8 03/19/2014   A/P: Prior elevated A1c-with weight gain we will recheck A1c today   #Right tennis  elbow- noted several weeks back- significant improvement  Recommended follow up: Return in about 1 year (around 06/03/2022) for physical or sooner if needed.Schedule b4 you leave.  Lab/Order associations: fasting   ICD-10-CM   1. Preventative health care  Z00.00 CBC with Differential/Platelet    Comprehensive metabolic panel    Lipid panel    PSA    Hemoglobin A1c    2. Hyperlipidemia, unspecified hyperlipidemia type  E78.5 CBC with Differential/Platelet    Comprehensive metabolic panel    Lipid panel    3. Hyperglycemia  R73.9 Hemoglobin A1c    4. Gastroesophageal reflux disease without esophagitis  K21.9     5. Screening for prostate cancer  Z12.5 PSA     Meds ordered this encounter  Medications   omeprazole (PRILOSEC) 40 MG capsule    Sig: Take 1 capsule (40 mg total) by mouth daily.    Dispense:  30 capsule    Refill:  3   Return precautions advised.  Garret Reddish, MD

## 2021-09-19 ENCOUNTER — Other Ambulatory Visit: Payer: Self-pay | Admitting: Family Medicine

## 2021-10-14 ENCOUNTER — Other Ambulatory Visit: Payer: Self-pay | Admitting: Family Medicine

## 2021-11-15 ENCOUNTER — Other Ambulatory Visit: Payer: Self-pay | Admitting: Family Medicine

## 2021-11-27 ENCOUNTER — Other Ambulatory Visit: Payer: Self-pay | Admitting: Family Medicine

## 2021-11-27 MED ORDER — OMEPRAZOLE 40 MG PO CPDR: 40.0000 mg | DELAYED_RELEASE_CAPSULE | Freq: Every day | ORAL | 3 refills | Status: AC

## 2021-11-27 NOTE — Telephone Encounter (Signed)
Patient comment: Good morning Dr. Durene Cal, your suggested change to 40 mg has completely eliminated the acid reflux  issue, I tried not taking it and the issue returned immediately, is it possible to change this prescription to 90 day refill. Thanks so much

## 2022-06-05 ENCOUNTER — Encounter: Payer: Self-pay | Admitting: Family Medicine

## 2023-11-07 ENCOUNTER — Emergency Department (HOSPITAL_COMMUNITY)

## 2023-11-07 ENCOUNTER — Observation Stay (HOSPITAL_COMMUNITY)
Admission: EM | Admit: 2023-11-07 | Discharge: 2023-11-09 | Disposition: A | Discharge status: Home / Self Care | Attending: Family Medicine | Admitting: Family Medicine

## 2023-11-07 ENCOUNTER — Other Ambulatory Visit: Payer: Self-pay

## 2023-11-07 DIAGNOSIS — K219 Gastro-esophageal reflux disease without esophagitis: Secondary | ICD-10-CM | POA: Diagnosis not present

## 2023-11-07 DIAGNOSIS — N179 Acute kidney failure, unspecified: Secondary | ICD-10-CM | POA: Diagnosis not present

## 2023-11-07 DIAGNOSIS — R41841 Cognitive communication deficit: Secondary | ICD-10-CM | POA: Diagnosis not present

## 2023-11-07 DIAGNOSIS — S066X0A Traumatic subarachnoid hemorrhage without loss of consciousness, initial encounter: Secondary | ICD-10-CM | POA: Diagnosis present

## 2023-11-07 DIAGNOSIS — Z23 Encounter for immunization: Secondary | ICD-10-CM | POA: Insufficient documentation

## 2023-11-07 DIAGNOSIS — E785 Hyperlipidemia, unspecified: Secondary | ICD-10-CM | POA: Diagnosis not present

## 2023-11-07 DIAGNOSIS — Z79899 Other long term (current) drug therapy: Secondary | ICD-10-CM | POA: Diagnosis not present

## 2023-11-07 DIAGNOSIS — I700 Atherosclerosis of aorta: Secondary | ICD-10-CM | POA: Insufficient documentation

## 2023-11-07 DIAGNOSIS — S92331A Displaced fracture of third metatarsal bone, right foot, initial encounter for closed fracture: Secondary | ICD-10-CM | POA: Diagnosis not present

## 2023-11-07 DIAGNOSIS — T148XXA Other injury of unspecified body region, initial encounter: Secondary | ICD-10-CM | POA: Diagnosis not present

## 2023-11-07 DIAGNOSIS — R2681 Unsteadiness on feet: Secondary | ICD-10-CM | POA: Diagnosis not present

## 2023-11-07 DIAGNOSIS — W232XXA Caught, crushed, jammed or pinched between a moving and stationary object, initial encounter: Secondary | ICD-10-CM | POA: Diagnosis not present

## 2023-11-07 DIAGNOSIS — M1990 Unspecified osteoarthritis, unspecified site: Secondary | ICD-10-CM | POA: Diagnosis not present

## 2023-11-07 DIAGNOSIS — S12591A Other nondisplaced fracture of sixth cervical vertebra, initial encounter for closed fracture: Secondary | ICD-10-CM

## 2023-11-07 DIAGNOSIS — S92334A Nondisplaced fracture of third metatarsal bone, right foot, initial encounter for closed fracture: Secondary | ICD-10-CM

## 2023-11-07 LAB — COMPREHENSIVE METABOLIC PANEL WITH GFR
ALT: 31 U/L (ref 0–44)
AST: 52 U/L — ABNORMAL HIGH (ref 15–41)
Albumin: 3.6 g/dL (ref 3.5–5.0)
Alkaline Phosphatase: 75 U/L (ref 38–126)
Anion gap: 27 — ABNORMAL HIGH (ref 5–15)
BUN: 19 mg/dL (ref 8–23)
CO2: 10 mmol/L — ABNORMAL LOW (ref 22–32)
Calcium: 8.9 mg/dL (ref 8.9–10.3)
Chloride: 101 mmol/L (ref 98–111)
Creatinine, Ser: 1.64 mg/dL — ABNORMAL HIGH (ref 0.61–1.24)
GFR, Estimated: 46 mL/min — ABNORMAL LOW (ref >60)
Glucose, Bld: 252 mg/dL — ABNORMAL HIGH (ref 70–99)
Potassium: 4.2 mmol/L (ref 3.5–5.1)
Sodium: 138 mmol/L (ref 135–145)
Total Bilirubin: 0.8 mg/dL (ref 0.0–1.2)
Total Protein: 6.7 g/dL (ref 6.5–8.1)

## 2023-11-07 LAB — CBC
HCT: 46.5 % (ref 39.0–52.0)
Hemoglobin: 15 g/dL (ref 13.0–17.0)
MCH: 30.5 pg (ref 26.0–34.0)
MCHC: 32.3 g/dL (ref 30.0–36.0)
MCV: 94.7 fL (ref 80.0–100.0)
Platelets: 360 K/uL (ref 150–400)
RBC: 4.91 MIL/uL (ref 4.22–5.81)
RDW: 13.1 % (ref 11.5–15.5)
WBC: 8.2 K/uL (ref 4.0–10.5)
nRBC: 0 % (ref 0.0–0.2)

## 2023-11-07 LAB — CK TOTAL AND CKMB (NOT AT ARMC)
CK, MB: 12.4 ng/mL — ABNORMAL HIGH (ref 0.5–5.0)
Total CK: 348 U/L (ref 49–397)

## 2023-11-07 LAB — I-STAT CG4 LACTIC ACID, ED
Lactic Acid, Venous: >15 mmol/L (ref 0.5–1.9)
Lactic Acid, Venous: 1.2 mmol/L (ref 0.5–1.9)

## 2023-11-07 LAB — SAMPLE TO BLOOD BANK

## 2023-11-07 LAB — I-STAT CHEM 8, ED
BUN: 20 mg/dL (ref 8–23)
Calcium, Ion: 1.11 mmol/L — ABNORMAL LOW (ref 1.15–1.40)
Chloride: 107 mmol/L (ref 98–111)
Creatinine, Ser: 1.4 mg/dL — ABNORMAL HIGH (ref 0.61–1.24)
Glucose, Bld: 238 mg/dL — ABNORMAL HIGH (ref 70–99)
HCT: 46 % (ref 39.0–52.0)
Hemoglobin: 15.6 g/dL (ref 13.0–17.0)
Potassium: 4.3 mmol/L (ref 3.5–5.1)
Sodium: 137 mmol/L (ref 135–145)
TCO2: 11 mmol/L — ABNORMAL LOW (ref 22–32)

## 2023-11-07 LAB — PROTIME-INR
INR: 1 (ref 0.8–1.2)
Prothrombin Time: 13.6 s (ref 11.4–15.2)

## 2023-11-07 LAB — ETHANOL: Alcohol, Ethyl (B): <15 mg/dL (ref <15)

## 2023-11-07 MED ORDER — IBUPROFEN 200 MG PO TABS
600.0000 mg | ORAL_TABLET | Freq: Four times a day (QID) | ORAL | Status: DC
  Administered 2023-11-07 – 2023-11-09 (×7): 600 mg via ORAL
  Filled 2023-11-07 (×8): qty 3

## 2023-11-07 MED ORDER — SODIUM CHLORIDE 0.9 % IV BOLUS
1000.0000 mL | Freq: Once | INTRAVENOUS | Status: AC
  Administered 2023-11-07: 1000 mL via INTRAVENOUS

## 2023-11-07 MED ORDER — IOHEXOL 350 MG/ML SOLN
75.0000 mL | Freq: Once | INTRAVENOUS | Status: AC | PRN
  Administered 2023-11-07: 75 mL via INTRAVENOUS

## 2023-11-07 MED ORDER — ACETAMINOPHEN 325 MG PO TABS
650.0000 mg | ORAL_TABLET | Freq: Four times a day (QID) | ORAL | Status: DC | PRN
  Administered 2023-11-08: 650 mg via ORAL
  Filled 2023-11-07: qty 2

## 2023-11-07 MED ORDER — ACETAMINOPHEN 650 MG RE SUPP: 650.0000 mg | Freq: Four times a day (QID) | RECTAL | Status: DC | PRN

## 2023-11-07 MED ORDER — METHOCARBAMOL 500 MG PO TABS
1000.0000 mg | ORAL_TABLET | Freq: Three times a day (TID) | ORAL | Status: DC
  Administered 2023-11-07 – 2023-11-09 (×7): 1000 mg via ORAL
  Filled 2023-11-07 (×7): qty 2

## 2023-11-07 MED ORDER — TETANUS-DIPHTH-ACELL PERTUSSIS 5-2-15.5 LF-MCG/0.5 IM SUSP
0.5000 mL | Freq: Once | INTRAMUSCULAR | Status: AC
  Administered 2023-11-07: 0.5 mL via INTRAMUSCULAR
  Filled 2023-11-07: qty 0.5

## 2023-11-07 MED ORDER — POLYETHYLENE GLYCOL 3350 17 G PO PACK: 17.0000 g | PACK | Freq: Every day | ORAL | Status: DC | PRN

## 2023-11-07 MED ORDER — HYDROMORPHONE HCL 1 MG/ML IJ SOLN
0.5000 mg | Freq: Once | INTRAMUSCULAR | Status: AC
  Administered 2023-11-07: 0.5 mg via INTRAVENOUS
  Filled 2023-11-07: qty 1

## 2023-11-07 MED ORDER — FENTANYL CITRATE (PF) 50 MCG/ML IJ SOSY
50.0000 ug | PREFILLED_SYRINGE | Freq: Once | INTRAMUSCULAR | Status: AC
  Administered 2023-11-07: 50 ug via INTRAVENOUS
  Filled 2023-11-07: qty 1

## 2023-11-07 MED ORDER — SODIUM CHLORIDE 0.9% FLUSH
3.0000 mL | Freq: Two times a day (BID) | INTRAVENOUS | Status: DC
  Administered 2023-11-07 – 2023-11-09 (×3): 3 mL via INTRAVENOUS

## 2023-11-07 MED ORDER — TRAMADOL HCL 50 MG PO TABS
50.0000 mg | ORAL_TABLET | Freq: Four times a day (QID) | ORAL | Status: DC | PRN
  Administered 2023-11-07 – 2023-11-08 (×2): 100 mg via ORAL
  Administered 2023-11-08: 50 mg via ORAL
  Administered 2023-11-08 – 2023-11-09 (×3): 100 mg via ORAL
  Filled 2023-11-07 (×6): qty 2

## 2023-11-07 MED ORDER — MORPHINE SULFATE (PF) 2 MG/ML IV SOLN
2.0000 mg | Freq: Once | INTRAVENOUS | Status: AC
  Administered 2023-11-07: 2 mg via INTRAVENOUS
  Filled 2023-11-07: qty 1

## 2023-11-07 NOTE — ED Triage Notes (Addendum)
 Pt BIB EMS after pt was stuck underneath aprox 1200 lbs of plywood for aprox 40 min. EMS reports pt was unresponsive when they first arrived on scene. Pt now has GCS of 15. Pt arrived in c-collar. Vital signs stable at this time. Pt complaining of right sided abdominal pain and left foot pain.

## 2023-11-07 NOTE — Progress Notes (Signed)
 Orthopedic Tech Progress Note Patient Details:  Mike Edwards Mar 12, 1960 [id number]  Ortho Devices Type of Ortho Device: Postop shoe/boot Ortho Device/Splint Location: RLE/fitted and removed Ortho Device/Splint Interventions: Ordered, Application, Adjustment   Post Interventions Patient Tolerated: Well Instructions Provided: Care of device, Adjustment of device  [name] 11/07/2023, 4:11 PM

## 2023-11-07 NOTE — H&P (Signed)
 H&P Note  LOTTIE SISKA 09/12/1960  [id number].     Chief Complaint/Reason for Consult: Level 1 trauma due to crush injury    HPI:  Rollyn Scialdone is a 63 year old male that presented to the ED via EMS after enduring a crush injury at work. Per EMS information approximately 1200 lbs fell onto patient. Patient was pinned under the material for about 40 minutes. When EMS arrived, they reported that patient was unresponsive but regained consciousness. In route patient was noted to have GCS of 10. Upon arrival, GCS was noted to be 15. C-Collar in place.   In the trauma bay, patient reports that he fell on his back and the wood supply fell all around him and on top of him. He does report that he hit his head. He reports right sided abdominal pain and bilateral foot pain.  Patient provided with fluids and noted to be hemodynamically stable.   Fast exam was completed and was negative. Chest xray and pelvic xray completed showing no acute findings.   Laboratory work up includes:  -CMP that showed abnormalities of CO2 10, glucose 252, Cr 1.64, AST 52, GFR 46, Anion gap 27 -CBC WNL -Ethanol <15 -Prothronbin time 13.6 and INR 1.0 -Total CK 348 and CK,MB 12.4 -I-Stat chem 8 showed abnormalities of Cr 1.40, glucose 238, ion calcium 1.11, and TCO2 11 -Lactic acid >15.0  Patient was transferred for completion of trauma CT imaging. Additional imaging obtained as listed below after trauma CT imaging completed.  Daily Medications: Denies medications and chronic medical conditions Anticoagulants/Antiplatelets: Denies Surgical history: Denies Alcohol use: Rarely Tobacco use: Denies Illicit drug use: Denies Allergies: NKDA  ROS: Per HPI  Blood pressure 108/72, pulse (!) 110, resp. rate 16, height 6' (1.829 m), weight 95.3 kg, SpO2 97%. Physical Exam:  General: Pleasant male who is laying in bed in NAD.  HEENT: Head is normocephalic. Abrasions noted to the scalp. C-collar in  place. Sclera are noninjected. EOMI. Ears and nose without any masses or lesions.  Mouth is pink and moist. Heart: HR normal. Palpable radial and pedal pulses bilaterally. Lungs: CTAB. Respiratory effort nonlabored. Abd: Soft, ND. Tenderness to palpation of right abdomen. MS: Able to move all 4 extremities. No edema noted.  Skin: Warm and dry. Neuro: Speech is normal. Sensation is normal throughout Psych: A&Ox3 with an appropriate affect.   No results found for this or any previous visit (from the past 48 hours). No results found.    Assessment/Plan -CT head showed scalp soft tissue injury but no skull fracture.  -CT Chest, Abd, and Pelvis showed no evidence of acute trauma. 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  -CT cervical spine showed evidence of wide spread cervical spinal canal hemorrhage, origin unclear. Broad-based posterior subcutaneous hematoma/contusion overlying the lower cervical spine at C6-C7.  -Right foot xray showed displaced right oblique fracture through distal shaft of the third metatarsal  -Left foot xray showed no acute findings.  -Left elbow xray was negative   ======   63 year old male with crush injury due to wood material falling on him while at work   Scalp soft tissue injury - Conservative management with ice and pain management. Monitor exam Cervical spinal canal hemorrhage - MRI pending. Consult pending results. Right foot displaced oblique fracture through distal shaft of the third metatarsal - Consult ortho. Appreciate their recommendations.   Admit to hospitalist.  FEN: NPO currently  VTE: Hold due to above ID: Tdap provided in the trauma bay   I reviewed ED notes, specialist notes, nursing notes, last 24 h vitals and pain scores, last 48 h intake and output, last 24 h labs and trends, and last 24 h imaging results.  This care required high  level of medical decision making.    Marjorie Favre, [facility] 11/07/2023, 11:30 AM Please see Amion for pager number during day hours 7:00am-4:30pm

## 2023-11-07 NOTE — ED Notes (Signed)
 Patient transported to MRI

## 2023-11-07 NOTE — Progress Notes (Signed)
 Orthopedic Tech Progress Note Patient Details:  Mike Edwards Oct 02, 1960 [id number]  Patient ID: Gilmore VEAR Grayer, male   DOB: Dec 01, 1960, 64 y.o.   MRN: [id number] Respond to level 1 trauma. [name] 11/07/2023, 1:04 PM

## 2023-11-07 NOTE — H&P (Addendum)
 History and Physical   Mike Edwards:[id number] DOB: 02-10-1960 DOA: 11/07/2023  PCP: Katrinka Garnette KIDD, MD   Patient coming from: Home  Chief Complaint: Trauma  HPI: Mike Edwards is a 63 y.o. male with medical history significant of hyperlipidemia, GERD presenting after stack of plywood fell on him.  Patient was in his workshop and a stack of plywood fell on him.  Large stack per EMS, approximately 1200 pounds.  He was under the stack for around 30 minutes before he could be extracted.  Patient reported that he fell and the wood fell on him and around him.  Did hit his head.  Brief unresponsive episode.  Sounds like his watch likely detected the fall and called EMS.  Denies fevers, chills, chest pain, shortness of breath, abdominal pain, constipation, diarrhea, nausea, vomiting    ED Course: Vital signs in the ED notable for blood pressure in the 90s-130s systolic, heart rate in the 80s-110s.  Lab workup included CMP with bicarb 10, gap 27, creatinine elevated to 1.64 baseline 1, glucose 252, AST 52.  CBC within normal limits.  PT and INR normal.  CK normal.  Lactic acid initially elevated to greater than 15, normal on repeat.  Urinalysis pending.  Ethanol level negative.  Trauma imaging included chest x-ray which showed no acute normality, pelvic x-ray showed no acute normality, left elbow x-ray showed no acute abnormality, left foot x-ray showed no acute abnormality.  Left knee x-ray pending.  Right foot x-ray showed minimally displaced fracture of the distal third metatarsal.  CT head showed soft tissue injury but no acute intracranial abnormality or skull fracture.  CT C-spine showed widespread cervical spinal canal hemorrhage of unclear etiology with subcutaneous hematoma at C6-C7, MRI recommended.  CT chest abdomen pelvis showed no acute normality but noted pars defect at L5.  MRI of the cervical spine has been ordered and trauma surgery plans to follow this  up and consult neurosurgery as indicated.  Trauma surgery consulted and prior recommended MRI and possible neurosurgery consult.  Recommended patient remain n.p.o. with SCDs for now.   Review of Systems: As per HPI otherwise all other systems reviewed and are negative.  No past medical history on file.  Hyperlipidemia GERD - Awaiting for merge with old chart  Social History  has no history on file for tobacco use, alcohol use, and drug use.  Not on File  No family history on file.  Prior to Admission medications   Medication Sig Start Date End Date Taking? Authorizing Provider  amLODipine (NORVASC) 5 MG tablet Take 5 mg by mouth daily. Patient not taking: Reported on 11/07/2023 11/05/23   [provider]  rosuvastatin (CRESTOR) 10 MG tablet Take 10 mg by mouth at bedtime. Patient not taking: Reported on 11/07/2023 11/05/23   [provider]    Physical Exam: Vitals:   11/07/23 1515 11/07/23 1530 11/07/23 1545 11/07/23 1600  BP: 118/87 (!) 131/96 128/87   Pulse: 95 94 (!) 101 89  Resp: 17 16 19 11   Temp: 98.3 F (36.8 C)     TempSrc: Oral     SpO2: 100% 100% 100% 100%  Weight:      Height:        Physical Exam Constitutional:      General: He is not in acute distress.    Appearance: Normal appearance.  HENT:     Head: Normocephalic and atraumatic.     Mouth/Throat:     Mouth: Mucous membranes are  moist.     Pharynx: Oropharynx is clear.  Eyes:     Extraocular Movements: Extraocular movements intact.     Pupils: Pupils are equal, round, and reactive to light.  Cardiovascular:     Rate and Rhythm: Normal rate and regular rhythm.     Pulses: Normal pulses.     Heart sounds: Normal heart sounds.  Pulmonary:     Effort: Pulmonary effort is normal. No respiratory distress.     Breath sounds: Normal breath sounds.  Abdominal:     General: Bowel sounds are normal. There is no distension.     Palpations: Abdomen is soft.     Tenderness: There is no  abdominal tenderness.  Musculoskeletal:        General: No swelling or deformity.     Comments: L knee pain, R foot pain  Skin:    General: Skin is warm and dry.  Neurological:     General: No focal deficit present.     Mental Status: Mental status is at baseline.     Labs on Admission: I have personally reviewed following labs and imaging studies  CBC: Recent Labs  Lab 11/07/23 1123 11/07/23 1129  WBC 8.2  --   HGB 15.0 15.6  HCT 46.5 46.0  MCV 94.7  --   PLT 360  --     Basic Metabolic Panel: Recent Labs  Lab 11/07/23 1123 11/07/23 1129  NA 138 137  K 4.2 4.3  CL 101 107  CO2 10*  --   GLUCOSE 252* 238*  BUN 19 20  CREATININE 1.64* 1.40*  CALCIUM 8.9  --     GFR: Estimated Creatinine Clearance: 63 mL/min (A) (by C-G formula based on SCr of 1.4 mg/dL (H)).  Liver Function Tests: Recent Labs  Lab 11/07/23 1123  AST 52*  ALT 31  ALKPHOS 75  BILITOT 0.8  PROT 6.7  ALBUMIN 3.6    Urine analysis: No results found for: COLORURINE, APPEARANCEUR, LABSPEC, PHURINE, GLUCOSEU, HGBUR, BILIRUBINUR, KETONESUR, PROTEINUR, UROBILINOGEN, NITRITE, LEUKOCYTESUR  Radiological Exams on Admission: DG Knee 2 Views Left Result Date: 11/07/2023 CLINICAL DATA:  Left knee pain, crush injury EXAM: LEFT KNEE - 1-2 VIEW COMPARISON:  None Available. FINDINGS: Frontal and cross-table lateral views of the left knee are obtained. No acute displaced fracture, subluxation, or dislocation. Mild medial compartmental joint space narrowing. Small suprapatellar joint effusion. Soft tissues are otherwise unremarkable. IMPRESSION: 1. Small joint effusion. 2. No acute displaced fracture. 3. Mild osteoarthritis. Electronically Signed   By: Ozell Daring M.D.   On: 11/07/2023 16:17   MR Cervical Spine Wo Contrast Result Date: 11/07/2023 EXAM: MRI CERVICAL SPINE WITHOUT CONTRAST 11/07/2023 02:25:02 PM TECHNIQUE: Multiplanar multisequence MRI of the cervical spine was  performed. COMPARISON: CT of the cervical spine 11/07/2023. CLINICAL HISTORY: Neck trauma, ligament injury suspected (Age >= 16y). FINDINGS: BONES AND ALIGNMENT: Normal alignment. Normal vertebral body heights. Endplate marrow changes are present anteriorly at the inferior endplate of C6 with some increased T2 signal in the disc space suggesting trauma and noncompressed fracture. Edema is present within the posterior ligamentous complex from C2 to C7. SPINAL CORD: Normal spinal cord size. Subarachnoid hemorrhage is again noted within the spinal canal from C3 through C7. No abnormal spinal cord signal. SOFT TISSUES: No paraspinal mass. Edema is present C2 through C7 and in the paraspinous musculature bilaterally, right greater than left. C2-C3: No significant disc herniation. No spinal canal stenosis or neural foraminal narrowing. C3-C4: No significant disc herniation. No  spinal canal stenosis or neural foraminal narrowing. C4-C5: A broad based disc osteophyte is asymmetric to the left with moderate to severe left foraminal stenosis. C5-C6: A broad based disc osteophyte is asymmetric to the left with moderate to severe left foraminal stenosis. C6-C7: A rightward disc protrusion is present with moderate central and severe right foraminal stenosis. C7-T1: No significant disc herniation. No spinal canal stenosis or neural foraminal narrowing. IMPRESSION: 1. Subarachnoid hemorrhage within the spinal canal from C3 through C7. 2. Endplate marrow changes at the inferior endplate of C6 with increased T2 signal in the disc space, suggesting traumatic noncompressed fracture. 3. Edema within the posterior ligamentous complex from C2 to C7 and in the paraspinous musculature bilaterally, right greater than left consistent with acute injury. 4. Rightward disc protrusion at C6-C7 with moderate central and severe right foraminal stenosis. 5. Broad based disc osteophytes at C4-C5 and C5-C6 asymmetric to the left with moderate to  severe left foraminal stenosis. Electronically signed by: Lonni Necessary MD 11/07/2023 03:07 PM EST RP Workstation: [id number]   DG Foot 2 Views Left Result Date: 11/07/2023 CLINICAL DATA:  Crush injury with left foot pain. EXAM: LEFT FOOT - 2 VIEW COMPARISON:  None Available. FINDINGS: Moderate degenerative changes of the first MTP joint. No acute fracture or dislocation. Small inferior calcaneal spur. Soft tissues unremarkable. IMPRESSION: 1. No acute findings. 2. Moderate degenerative changes of the first MTP joint. Electronically Signed   By: Toribio Agreste M.D.   On: 11/07/2023 13:06   DG Elbow 2 Views Left Result Date: 11/07/2023 CLINICAL DATA:  Trauma to left elbow. EXAM: LEFT ELBOW - 2 VIEW COMPARISON:  None Available. FINDINGS: There is no evidence of fracture, dislocation, or joint effusion. There is no evidence of arthropathy or other focal bone abnormality. Soft tissues are unremarkable. IMPRESSION: Negative. Electronically Signed   By: Toribio Agreste M.D.   On: 11/07/2023 13:05   DG Foot 2 Views Right Result Date: 11/07/2023 CLINICAL DATA:  Crush injury to right foot. EXAM: RIGHT FOOT - 2 VIEW COMPARISON:  None Available. FINDINGS: Exam demonstrates a minimally displaced oblique fracture through the distal shaft of the third metatarsal. Mild degenerate changes over the first MTP joint. Minimal degenerative change of the midfoot. Small inferior calcaneal spur. Soft tissues are unremarkable. IMPRESSION: Minimally displaced oblique fracture through the distal shaft of the third metatarsal. Electronically Signed   By: Toribio Agreste M.D.   On: 11/07/2023 13:05   CT Cervical Spine Wo Contrast Result Date: 11/07/2023 EXAM: CT CERVICAL SPINE WITHOUT CONTRAST 11/07/2023 11:40:00 AM TECHNIQUE: CT of the cervical spine was performed without the administration of intravenous contrast. Multiplanar reformatted images are provided for review. Automated exposure control, iterative reconstruction, and/or  weight based adjustment of the mA/kV was utilized to reduce the radiation dose to as low as reasonably achievable. COMPARISON: Head and Chest CT 11/07/2023, reported separately. CLINICAL HISTORY: 63 year old male with neck trauma. FINDINGS: CERVICAL SPINE: BONES AND ALIGNMENT: Maintained cervical lordosis. No acute fracture or traumatic malalignment. posterior elements appear intact. DEGENERATIVE CHANGES: Chronic disc and endplate degeneration asymmetric to the left at C3-C4, and more circumferential at C6-C7. At least moderate degenerative spinal stenosis results at the latter. SOFT TISSUES: Abnormal circumferential increased density within the spinal canal surrounding the spinal cord (series 9 image 44 at the C2-C3 level), resembles traumatic hemorrhage within the canal. This abates at the cervicomedullary junction. Visible posterior fossa appears negative. Prevertebral soft tissue contours remain normal. Broad based posterior subcutaneous hematoma/contusion to the left of  midline overlying the lower cervical spine levels, centered at C6-C7. No soft tissue gas identified. IMPRESSION: 1. Evidence of widespread cervical spinal canal hemorrhage, although origin unclear. No cervical fracture, dislocation, or CT evidence of ligamentous injury. Non-contrast MRI may be valuable. 2. Broad-based posterior subcutaneous hematoma/contusion overlying the lower cervical spine centered at C6-C7. 3. Degenerative changes with at least moderate spinal stenosis at C6-C7. 4. Study reviewed in person with Dr. Paola at 11:50 AM today. Electronically signed by: Helayne Hurst MD 11/07/2023 12:06 PM EST RP Workstation: [id number]   CT CHEST ABDOMEN PELVIS W CONTRAST Result Date: 11/07/2023 CLINICAL DATA:  Blunt poly trauma, trapped under 1200 pounds of plywood. EXAM: CT CHEST, ABDOMEN, AND PELVIS WITH CONTRAST TECHNIQUE: Multidetector CT imaging of the chest, abdomen and pelvis was performed following the standard protocol during bolus  administration of intravenous contrast. RADIATION DOSE REDUCTION: This exam was performed according to the departmental dose-optimization program which includes automated exposure control, adjustment of the mA and/or kV according to patient size and/or use of iterative reconstruction technique. CONTRAST:  75mL OMNIPAQUE IOHEXOL 350 MG/ML SOLN COMPARISON:  None Available. FINDINGS: CT CHEST FINDINGS Cardiovascular: Atherosclerotic calcification of the aortic valve. Heart is at the upper limits of normal in size to mildly enlarged. No pericardial effusion. Mediastinum/Nodes: No pathologically enlarged mediastinal, hilar or axillary lymph nodes. Esophagus is grossly unremarkable. Lungs/Pleura: Mild dependent atelectasis. Lungs are otherwise clear. No pleural fluid or pneumothorax. Airway is unremarkable. Musculoskeletal: Degenerative changes in the spine.  No fracture. CT ABDOMEN PELVIS FINDINGS Hepatobiliary: Tiny cysts in the right hepatic lobe. No specific follow-up necessary. Liver and gallbladder are otherwise unremarkable. No biliary ductal dilatation. Pancreas: Negative. Spleen: Negative. Adrenals/Urinary Tract: Adrenal glands are unremarkable. Low-attenuation lesions in kidneys. No specific follow-up necessary. Kidneys are otherwise unremarkable. Ureters are decompressed. Bladder is low in volume. Stomach/Bowel: Stomach, small bowel, appendix and colon are unremarkable. Vascular/Lymphatic: Vascular structures are unremarkable. No pathologically enlarged lymph nodes. Reproductive: Prostate is normal in size. Other: Small right inguinal hernia contains fat. Mesenteries and peritoneum are unremarkable. No free fluid. Musculoskeletal: No fracture. Degenerative changes in the spine. Bilateral L5 pars defects with grade 1 anterolisthesis. IMPRESSION: 1. No evidence of acute trauma. 2. 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 3.  Aortic atherosclerosis  (ICD10-I70.0). Electronically Signed   By: Newell Eke M.D.   On: 11/07/2023 12:02   CT HEAD WO CONTRAST Result Date: 11/07/2023 EXAM: CT HEAD WITHOUT CONTRAST 11/07/2023 11:40:00 AM TECHNIQUE: CT of the head was performed without the administration of intravenous contrast. Automated exposure control, iterative reconstruction, and/or weight based adjustment of the mA/kV was utilized to reduce the radiation dose to as low as reasonably achievable. COMPARISON: None available. CLINICAL HISTORY: 63 year old male status post crush injury. FINDINGS: BRAIN AND VENTRICLES: No acute hemorrhage. No evidence of acute infarct. No hydrocephalus. No extra-axial collection. No mass effect or midline shift. Normal for age brain volume. No suspicious intracranial vascular hyperdensity. ORBITS: Orbital soft tissues appear negative. SINUSES: Mild bilateral paranasal sinus inflammation. SOFT TISSUES AND SKULL: Left posterior convexity broad based scalp soft tissue hematoma and contusion. No scalp soft tissue gas. Underlying calvarium appears intact. No skull fracture identified. Middle ears and mastoids well aerated. Study reviewed in person with Dr. Paola at 11:50 AM today. IMPRESSION: 1. Scalp soft tissue injury. No skull fracture identified. 2. Normal for age non contrast CT appearance of the brain. 3. Study reviewed in person with Dr. Paola at 11:50 AM today. Electronically  signed by: Helayne Hurst MD 11/07/2023 12:01 PM EST RP Workstation: [id number]   DG Chest Port 1 View Result Date: 11/07/2023 CLINICAL DATA:  Possible crush injury. EXAM: PORTABLE CHEST 1 VIEW COMPARISON:  12/22/2014 FINDINGS: Lungs are adequately inflated and otherwise clear. Cardiomediastinal silhouette is normal. No acute fracture. IMPRESSION: No active disease. Electronically Signed   By: Toribio Agreste M.D.   On: 11/07/2023 12:00   DG Pelvis Portable Result Date: 11/07/2023 CLINICAL DATA:  Trauma.  Possible crush injury. EXAM: PORTABLE PELVIS  1-2 VIEWS COMPARISON:  None Available. FINDINGS: No evidence of fracture or dislocation over the hips or pelvis. Mild degenerate change of the spine. Several pelvic phleboliths. IMPRESSION: No acute findings. Electronically Signed   By: Toribio Agreste M.D.   On: 11/07/2023 11:59   EKG: Independently reviewed.  Sinus tachycardia 104 bpm.  Nonspecific T wave changes.  Low voltage multiple leads.  Assessment/Plan Active Problems:   Crush injury   AKI (acute kidney injury)   AKI > Creatinine elevated 1.64 from baseline 1.  Possibly related to traumatic event though not necessarily.  Lactic acid initially elevated but has since cleared with IV fluids.  CK a not elevated.  Urinalysis pending. > Received 2 L IV fluids in the ED. - Monitor overnight - Trend renal function and electrolytes - Follow-up urinalysis  Crush injury Cervical hemorrhage Metatarsal fracture > Fell along the garden about 12 pounds of plywood landed on and around the patient. > CT noted to have hemorrhage in the cervical spinal canal without clear etiology.  MRI ordered for follow-up. > Only other traumatic injury noted was soft tissue scalp injury and right foot minimally displaced fracture of the third metatarsal. > Trauma surgery consulted and are following, plan to consult neurosurgery as indicated based on MRI result. > Orthopedic surgery consulted and recommended cam boot or postop shoe for left foot fracture. - Monitor on telemetry overnight - Appreciate trauma surgery recommendations and assistance - Order placed for shoe/boot. Pain when [employer] attempted to place, held off on placement while in bed. - Follow-up MRI, trauma surgery plans to consult neurosurgery as indicated - Pain medication as needed - Supportive care  Hyperlipidemia GERD - Not currently taking medication for this   DVT prophylaxis: SCDs Code Status:   Full Family Communication:  Updated at bedside.  Disposition Plan:   Patient is  from:  Home  Anticipated DC to:  Home  Anticipated DC date:  1 to 3 days  Anticipated DC barriers: None  Consults called:  Trauma surgery, orthopedic surgery Admission status:  Observation, telemetry  Severity of Illness: The appropriate patient status for this patient is OBSERVATION. Observation status is judged to be reasonable and necessary in order to provide the required intensity of service to ensure the patient's safety. The patient's presenting symptoms, physical exam findings, and initial radiographic and laboratory data in the context of their medical condition is felt to place them at decreased risk for further clinical deterioration. Furthermore, it is anticipated that the patient will be medically stable for discharge from the hospital within 2 midnights of admission.    Marsa KATHEE Scurry MD [facility]  How to contact the [facility] Attending or Consulting provider 7A - 7P or covering provider during after hours 7P -7A, for this patient?   Check the care team in [facility] and look for a) attending/consulting [facility] provider listed and b) the [facility] team listed Log into [email or web address] and use [facility] universal password to access. If you do  not have the password, please contact the hospital operator. Locate the [facility] provider you are looking for under [facility] and page to a number that you can be directly reached. If you still have difficulty reaching the provider, please page the [facility] (Director on Call) for the Hospitalists listed on amion for assistance.  11/07/2023, 4:26 PM

## 2023-11-07 NOTE — ED Provider Notes (Addendum)
 [facility] AT [facility] Provider Note   CSN: [id number] Arrival date & time: 11/07/23  1116     Patient presents with: Trauma   Mike Edwards is a 63 y.o. male.   This is a 63 year old male presented the emergency department as a trauma.  See ED course for full HPI.        Prior to Admission medications   Medication Sig Start Date End Date Taking? Authorizing Provider  amLODipine (NORVASC) 5 MG tablet Take 5 mg by mouth daily. Patient not taking: Reported on 11/07/2023 11/05/23   [provider]  rosuvastatin (CRESTOR) 10 MG tablet Take 10 mg by mouth at bedtime. Patient not taking: Reported on 11/07/2023 11/05/23   [provider]    Allergies: Patient has no allergy information on record.    Review of Systems  Updated Vital Signs BP 128/87   Pulse (!) 101   Temp 98.3 F (36.8 C) (Oral)   Resp 19   Ht 6' (1.829 m)   Wt 95.3 kg   SpO2 100%   BMI 28.48 kg/m   Physical Exam Vitals and nursing note reviewed.  HENT:     Head: Normocephalic and atraumatic.     Comments: Patient was rolled has an abrasion to his left posterior head.  No obvious laceration    Nose: Nose normal.     Mouth/Throat:     Mouth: Mucous membranes are moist.  Eyes:     Conjunctiva/sclera: Conjunctivae normal.  Cardiovascular:     Rate and Rhythm: Normal rate and regular rhythm.  Pulmonary:     Effort: Pulmonary effort is normal.     Breath sounds: Normal breath sounds.  Abdominal:     General: Abdomen is flat. There is no distension.     Palpations: Abdomen is soft.     Tenderness: There is no abdominal tenderness. There is no guarding or rebound.  Musculoskeletal:     Comments: Bruising to the bilateral dorsum of feet.  Skin tear to the left elbow.  Tenderness to the same.  Chest wall stable nontender.  Pelvis stable nontender.  No midline spinal tenderness.  Skin:    General: Skin is warm and dry.     Capillary Refill: Capillary  refill takes less than 2 seconds.  Neurological:     General: No focal deficit present.     Mental Status: He is alert and oriented to person, place, and time.  Psychiatric:        Mood and Affect: Mood normal.        Behavior: Behavior normal.     (all labs ordered are listed, but only abnormal results are displayed) Labs Reviewed  COMPREHENSIVE METABOLIC PANEL WITH GFR - Abnormal; Notable for the following components:      Result Value   CO2 10 (*)    Glucose, Bld 252 (*)    Creatinine, Ser 1.64 (*)    AST 52 (*)    GFR, Estimated 46 (*)    Anion gap 27 (*)    All other components within normal limits  CK TOTAL AND CKMB (NOT AT [facility]) - Abnormal; Notable for the following components:   CK, MB 12.4 (*)    All other components within normal limits  I-STAT CHEM 8, ED - Abnormal; Notable for the following components:   Creatinine, Ser 1.40 (*)    Glucose, Bld 238 (*)    Calcium, Ion 1.11 (*)    TCO2 11 (*)  All other components within normal limits  I-STAT CG4 LACTIC ACID, ED - Abnormal; Notable for the following components:   Lactic Acid, Venous >15.0 (*)    All other components within normal limits  CBC  ETHANOL  PROTIME-INR  URINALYSIS, ROUTINE W REFLEX MICROSCOPIC  I-STAT CG4 LACTIC ACID, ED  SAMPLE TO BLOOD BANK    EKG: EKG Interpretation Date/Time:  Wednesday November 07 2023 11:41:58 EST Ventricular Rate:  104 PR Interval:  180 QRS Duration:  89 QT Interval:  353 QTC Calculation: 465 R Axis:   81  Text Interpretation: Sinus tachycardia Borderline right axis deviation Confirmed by Neysa Clap [phone]) on 11/07/2023 3:04:20 PM  Radiology: MR Cervical Spine Wo Contrast Result Date: 11/07/2023 EXAM: MRI CERVICAL SPINE WITHOUT CONTRAST 11/07/2023 02:25:02 PM TECHNIQUE: Multiplanar multisequence MRI of the cervical spine was performed. COMPARISON: CT of the cervical spine 11/07/2023. CLINICAL HISTORY: Neck trauma, ligament injury suspected (Age >= 16y).  FINDINGS: BONES AND ALIGNMENT: Normal alignment. Normal vertebral body heights. Endplate marrow changes are present anteriorly at the inferior endplate of C6 with some increased T2 signal in the disc space suggesting trauma and noncompressed fracture. Edema is present within the posterior ligamentous complex from C2 to C7. SPINAL CORD: Normal spinal cord size. Subarachnoid hemorrhage is again noted within the spinal canal from C3 through C7. No abnormal spinal cord signal. SOFT TISSUES: No paraspinal mass. Edema is present C2 through C7 and in the paraspinous musculature bilaterally, right greater than left. C2-C3: No significant disc herniation. No spinal canal stenosis or neural foraminal narrowing. C3-C4: No significant disc herniation. No spinal canal stenosis or neural foraminal narrowing. C4-C5: A broad based disc osteophyte is asymmetric to the left with moderate to severe left foraminal stenosis. C5-C6: A broad based disc osteophyte is asymmetric to the left with moderate to severe left foraminal stenosis. C6-C7: A rightward disc protrusion is present with moderate central and severe right foraminal stenosis. C7-T1: No significant disc herniation. No spinal canal stenosis or neural foraminal narrowing. IMPRESSION: 1. Subarachnoid hemorrhage within the spinal canal from C3 through C7. 2. Endplate marrow changes at the inferior endplate of C6 with increased T2 signal in the disc space, suggesting traumatic noncompressed fracture. 3. Edema within the posterior ligamentous complex from C2 to C7 and in the paraspinous musculature bilaterally, right greater than left consistent with acute injury. 4. Rightward disc protrusion at C6-C7 with moderate central and severe right foraminal stenosis. 5. Broad based disc osteophytes at C4-C5 and C5-C6 asymmetric to the left with moderate to severe left foraminal stenosis. Electronically signed by: Lonni Necessary MD 11/07/2023 03:07 PM EST RP Workstation: [id number]    DG Foot 2 Views Left Result Date: 11/07/2023 CLINICAL DATA:  Crush injury with left foot pain. EXAM: LEFT FOOT - 2 VIEW COMPARISON:  None Available. FINDINGS: Moderate degenerative changes of the first MTP joint. No acute fracture or dislocation. Small inferior calcaneal spur. Soft tissues unremarkable. IMPRESSION: 1. No acute findings. 2. Moderate degenerative changes of the first MTP joint. Electronically Signed   By: Toribio Agreste M.D.   On: 11/07/2023 13:06   DG Elbow 2 Views Left Result Date: 11/07/2023 CLINICAL DATA:  Trauma to left elbow. EXAM: LEFT ELBOW - 2 VIEW COMPARISON:  None Available. FINDINGS: There is no evidence of fracture, dislocation, or joint effusion. There is no evidence of arthropathy or other focal bone abnormality. Soft tissues are unremarkable. IMPRESSION: Negative. Electronically Signed   By: Toribio Agreste M.D.   On: 11/07/2023 13:05  DG Foot 2 Views Right Result Date: 11/07/2023 CLINICAL DATA:  Crush injury to right foot. EXAM: RIGHT FOOT - 2 VIEW COMPARISON:  None Available. FINDINGS: Exam demonstrates a minimally displaced oblique fracture through the distal shaft of the third metatarsal. Mild degenerate changes over the first MTP joint. Minimal degenerative change of the midfoot. Small inferior calcaneal spur. Soft tissues are unremarkable. IMPRESSION: Minimally displaced oblique fracture through the distal shaft of the third metatarsal. Electronically Signed   By: Toribio Agreste M.D.   On: 11/07/2023 13:05   CT Cervical Spine Wo Contrast Result Date: 11/07/2023 EXAM: CT CERVICAL SPINE WITHOUT CONTRAST 11/07/2023 11:40:00 AM TECHNIQUE: CT of the cervical spine was performed without the administration of intravenous contrast. Multiplanar reformatted images are provided for review. Automated exposure control, iterative reconstruction, and/or weight based adjustment of the mA/kV was utilized to reduce the radiation dose to as low as reasonably achievable. COMPARISON: Head  and Chest CT 11/07/2023, reported separately. CLINICAL HISTORY: 63 year old male with neck trauma. FINDINGS: CERVICAL SPINE: BONES AND ALIGNMENT: Maintained cervical lordosis. No acute fracture or traumatic malalignment. posterior elements appear intact. DEGENERATIVE CHANGES: Chronic disc and endplate degeneration asymmetric to the left at C3-C4, and more circumferential at C6-C7. At least moderate degenerative spinal stenosis results at the latter. SOFT TISSUES: Abnormal circumferential increased density within the spinal canal surrounding the spinal cord (series 9 image 44 at the C2-C3 level), resembles traumatic hemorrhage within the canal. This abates at the cervicomedullary junction. Visible posterior fossa appears negative. Prevertebral soft tissue contours remain normal. Broad based posterior subcutaneous hematoma/contusion to the left of midline overlying the lower cervical spine levels, centered at C6-C7. No soft tissue gas identified. IMPRESSION: 1. Evidence of widespread cervical spinal canal hemorrhage, although origin unclear. No cervical fracture, dislocation, or CT evidence of ligamentous injury. Non-contrast MRI may be valuable. 2. Broad-based posterior subcutaneous hematoma/contusion overlying the lower cervical spine centered at C6-C7. 3. Degenerative changes with at least moderate spinal stenosis at C6-C7. 4. Study reviewed in person with Dr. Paola at 11:50 AM today. Electronically signed by: Helayne Hurst MD 11/07/2023 12:06 PM EST RP Workstation: [id number]   CT CHEST ABDOMEN PELVIS W CONTRAST Result Date: 11/07/2023 CLINICAL DATA:  Blunt poly trauma, trapped under 1200 pounds of plywood. EXAM: CT CHEST, ABDOMEN, AND PELVIS WITH CONTRAST TECHNIQUE: Multidetector CT imaging of the chest, abdomen and pelvis was performed following the standard protocol during bolus administration of intravenous contrast. RADIATION DOSE REDUCTION: This exam was performed according to the departmental  dose-optimization program which includes automated exposure control, adjustment of the mA and/or kV according to patient size and/or use of iterative reconstruction technique. CONTRAST:  75mL OMNIPAQUE IOHEXOL 350 MG/ML SOLN COMPARISON:  None Available. FINDINGS: CT CHEST FINDINGS Cardiovascular: Atherosclerotic calcification of the aortic valve. Heart is at the upper limits of normal in size to mildly enlarged. No pericardial effusion. Mediastinum/Nodes: No pathologically enlarged mediastinal, hilar or axillary lymph nodes. Esophagus is grossly unremarkable. Lungs/Pleura: Mild dependent atelectasis. Lungs are otherwise clear. No pleural fluid or pneumothorax. Airway is unremarkable. Musculoskeletal: Degenerative changes in the spine.  No fracture. CT ABDOMEN PELVIS FINDINGS Hepatobiliary: Tiny cysts in the right hepatic lobe. No specific follow-up necessary. Liver and gallbladder are otherwise unremarkable. No biliary ductal dilatation. Pancreas: Negative. Spleen: Negative. Adrenals/Urinary Tract: Adrenal glands are unremarkable. Low-attenuation lesions in kidneys. No specific follow-up necessary. Kidneys are otherwise unremarkable. Ureters are decompressed. Bladder is low in volume. Stomach/Bowel: Stomach, small bowel, appendix and colon are unremarkable. Vascular/Lymphatic: Vascular structures are unremarkable.  No pathologically enlarged lymph nodes. Reproductive: Prostate is normal in size. Other: Small right inguinal hernia contains fat. Mesenteries and peritoneum are unremarkable. No free fluid. Musculoskeletal: No fracture. Degenerative changes in the spine. Bilateral L5 pars defects with grade 1 anterolisthesis. IMPRESSION: 1. No evidence of acute trauma. 2. 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 3.  Aortic atherosclerosis (ICD10-I70.0). Electronically Signed   By: Newell Eke M.D.   On: 11/07/2023 12:02   CT HEAD WO CONTRAST Result Date:  11/07/2023 EXAM: CT HEAD WITHOUT CONTRAST 11/07/2023 11:40:00 AM TECHNIQUE: CT of the head was performed without the administration of intravenous contrast. Automated exposure control, iterative reconstruction, and/or weight based adjustment of the mA/kV was utilized to reduce the radiation dose to as low as reasonably achievable. COMPARISON: None available. CLINICAL HISTORY: 63 year old male status post crush injury. FINDINGS: BRAIN AND VENTRICLES: No acute hemorrhage. No evidence of acute infarct. No hydrocephalus. No extra-axial collection. No mass effect or midline shift. Normal for age brain volume. No suspicious intracranial vascular hyperdensity. ORBITS: Orbital soft tissues appear negative. SINUSES: Mild bilateral paranasal sinus inflammation. SOFT TISSUES AND SKULL: Left posterior convexity broad based scalp soft tissue hematoma and contusion. No scalp soft tissue gas. Underlying calvarium appears intact. No skull fracture identified. Middle ears and mastoids well aerated. Study reviewed in person with Dr. Paola at 11:50 AM today. IMPRESSION: 1. Scalp soft tissue injury. No skull fracture identified. 2. Normal for age non contrast CT appearance of the brain. 3. Study reviewed in person with Dr. Paola at 11:50 AM today. Electronically signed by: Helayne Hurst MD 11/07/2023 12:01 PM EST RP Workstation: [id number]   DG Chest Port 1 View Result Date: 11/07/2023 CLINICAL DATA:  Possible crush injury. EXAM: PORTABLE CHEST 1 VIEW COMPARISON:  12/22/2014 FINDINGS: Lungs are adequately inflated and otherwise clear. Cardiomediastinal silhouette is normal. No acute fracture. IMPRESSION: No active disease. Electronically Signed   By: Toribio Agreste M.D.   On: 11/07/2023 12:00   DG Pelvis Portable Result Date: 11/07/2023 CLINICAL DATA:  Trauma.  Possible crush injury. EXAM: PORTABLE PELVIS 1-2 VIEWS COMPARISON:  None Available. FINDINGS: No evidence of fracture or dislocation over the hips or pelvis. Mild  degenerate change of the spine. Several pelvic phleboliths. IMPRESSION: No acute findings. Electronically Signed   By: Toribio Agreste M.D.   On: 11/07/2023 11:59     .Critical Care  Performed by: Neysa Caron PARAS, DO Authorized by: Neysa Caron PARAS, DO   Critical care provider statement:    Critical care time (minutes):  30   Critical care was necessary to treat or prevent imminent or life-threatening deterioration of the following conditions:  Trauma   Critical care was time spent personally by me on the following activities:  Development of treatment plan with patient or surrogate, discussions with consultants, evaluation of patient's response to treatment, examination of patient, ordering and review of laboratory studies, ordering and review of radiographic studies, ordering and performing treatments and interventions, pulse oximetry, re-evaluation of patient's condition and review of old charts    Medications Ordered in the ED  iohexol (OMNIPAQUE) 350 MG/ML injection 75 mL (75 mLs Intravenous Contrast Given 11/07/23 1139)  sodium chloride 0.9 % bolus 1,000 mL (0 mLs Intravenous Stopped 11/07/23 1219)  fentaNYL (SUBLIMAZE) injection 50 mcg (50 mcg Intravenous Given 11/07/23 1148)  Tdap (ADACEL) injection 0.5 mL (0.5 mLs Intramuscular Given 11/07/23 1205)  sodium chloride 0.9 % bolus 1,000 mL (0 mLs Intravenous Stopped 11/07/23 1349)  HYDROmorphone (DILAUDID) injection 0.5 mg (0.5 mg Intravenous Given 11/07/23 1247)  morphine (PF) 2 MG/ML injection 2 mg (2 mg Intravenous Given 11/07/23 1512)    Clinical Course as of 11/07/23 1557  Wed Nov 07, 2023  1127 Presented as a level 1 trauma.  [place] a lumber [place]  multiple 4 x 8 pieces of plywood fell on top of him.  He was trapped underneath for roughly 30 minutes before being extricated.  Was minimally responsive with EMS initially but mentation has improved.  EMS reported stable vitals.  Arrived and evaluated in standard ATLS fashion. Trauma  surgery at bedside.  Maintaining his airway, equal breath sounds and pulses.  Somewhat confused but following commands and moving all extremities.  Chest x-ray without obvious pneumothorax his pelvis x-ray without obvious fracture.  Bedside ultrasound performed by Dr. Creed, trauma surgeon was negative.  Going to CT scan now.  [TY]  1144 Lactic Acid, Venous(!!): >15.0 IV fluids ordered [TY]  1200 CBC No anemia [TY]  1200 Protime-INR Normal [TY]  1223 CT HEAD WO CONTRAST MPRESSION: 1. Scalp soft tissue injury. No skull fracture identified. 2. Normal for age non contrast CT appearance of the brain. 3. Study reviewed in person with Dr. Paola at 11:50 AM today.  Electronically signed by: Helayne Hurst MD 11/07/2023 12:01 PM EST RP Workstation: [id number]   [TY]  1223 CT Cervical Spine Wo Contrast IMPRESSION: 1. Evidence of widespread cervical spinal canal hemorrhage, although origin unclear. No cervical fracture, dislocation, or CT evidence of ligamentous injury. Non-contrast MRI may be valuable. 2. Broad-based posterior subcutaneous hematoma/contusion overlying the lower cervical spine centered at C6-C7. 3. Degenerative changes with at least moderate spinal stenosis at C6-C7. 4. Study reviewed in person with Dr. Paola at 11:50 AM today.  Electronically signed by: Helayne Hurst MD 11/07/2023 12:06 PM EST RP Workstation: [id number]   [TY]  1223 CT CHEST ABDOMEN PELVIS W CONTRAST IMPRESSION: 1. No evidence of acute trauma. 2. 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 3.  Aortic atherosclerosis (ICD10-I70.0).   Electronically Signed   By: Newell Eke M.D.   [TY]  [phone] Patient complaining of pain to bilateral feet and to left elbow and to the back of his head.  X-rays of feet pending.  Will add on x-ray elbow.  Continues to have palpable DP pulses. Lower extremity compartements soft.  [TY]  1239 DG Foot 2 Views  Right Fracture to the third metatarsal on my review of images [TY]  1240 DG Foot 2 Views Left Does not appear to have obvious fractures on my independent review [TY]  1504 Spoke with Ortho regarding right foot fracture.  Recommending cam boot or postop shoe, weight-bear as tolerated and outpatient follow-up. [TY]  1517 MR Cervical Spine Wo Contrast IMPRESSION: 1. Subarachnoid hemorrhage within the spinal canal from C3 through C7. 2. Endplate marrow changes at the inferior endplate of C6 with increased T2 signal in the disc space, suggesting traumatic noncompressed fracture. 3. Edema within the posterior ligamentous complex from C2 to C7 and in the paraspinous musculature bilaterally, right greater than left consistent with acute injury. 4. Rightward disc protrusion at C6-C7 with moderate central and severe right foraminal stenosis. 5. Broad based disc osteophytes at C4-C5 and C5-C6 asymmetric to the left with moderate to severe left foraminal stenosis.   [TY]  1518 Dr. Paola with trauma said she will talk with NSGY regarding patient and still recommending medicine admission.  [TY]  Clinical Course User Index [TY] Neysa Caron PARAS, DO                                 Medical Decision Making A 63 year old male presenting emergency department as a trauma activation after being pinned under a large stack of plywood for close to 30 minutes.  He is afebrile initially tachycardic, but hemodynamically stable.  Maintaining oxygen saturation on room air.  Exam largely reassuring.  Soft compartments, equal pulses.  Clear breath sounds and soft nontender abdomen.  Initially concern for rhabdo given patient's markedly elevated lactate of 15, however improved with IV fluids.  Minor elevation in his creatinine.  Received 2 L of IV fluids.  Has fracture to right third metatarsal.  Ortho recommending postop shoe, weight-bear as tolerated and outpatient follow-up.  CT scan with possible hemorrhage,  follow-up MRI with longitudinal ligament injury, subarachnoid hemorrhage and mild cervical C6 fracture.  Trauma consulting neurosurgery.  Trauma is recommending admission for observation monitor renal function.  Amount and/or Complexity of Data Reviewed External Data Reviewed:     Details: Not on blood thinner.  No significant past medical history Labs: ordered. Decision-making details documented in ED Course. Radiology: ordered and independent interpretation performed. Decision-making details documented in ED Course.    Details: See ED course ECG/medicine tests: ordered. Discussion of management or test interpretation with external provider(s): Trauma  Risk Prescription drug management. Decision regarding hospitalization. Diagnosis or treatment significantly limited by social determinants of health.       Final diagnoses:  None    ED Discharge Orders     None          Neysa Caron PARAS, DO 11/07/23 1557    Neysa Caron PARAS, DO 11/07/23 1557

## 2023-11-07 NOTE — Progress Notes (Signed)
 Responded to page to support pt. That was trapped under wood for long period of time.  Chaplain provided emotional and spiritual support to pt. And wife.  Rayleen Dade, [place], [facility], Pager [phone]

## 2023-11-07 NOTE — ED Notes (Incomplete)
 Trauma Response Nurse Documentation   EVA VALLEE is a 63 y.o. male arriving to [facility] ED via EMS  On No antithrombotic. Trauma was activated as a Level 1 by Ozell Ford, Charge RN based on the following trauma criteria Crush injury to extremity.  Patient cleared for CT by Dr. Paola. Pt transported to CT with trauma response nurse present to monitor. RN remained with the patient throughout their absence from the department for clinical observation.   GCS 15.  Trauma MD Arrival Time: 1135.  History   See merged chart        Initial Focused Assessment (If applicable, or please see trauma documentation): AIrway - CLear  Breathing - Unlabored Circulation - strong peripheral pulses GCS - 15  CT's Completed:   CT Head, CT C-Spine, CT Chest w/ contrast, and CT abdomen/pelvis w/ contrast   Interventions:  Labs Xrays CT scans Pain control  Plan for disposition:  {Trauma Dispo:26867}   Consults completed:  {Trauma Consults:26862} at ***.  Event Summary:  Pt was at work, approx 1200# of plywood fell onto pt, lower legs/pelvis. Was initially unresponsive, -=on arrival to ED was A/O x 4- GCS - 15.  NO outward signs of injuries noted.  IV 18G left hand per EMS C-Collar per EMS  IV 18 G started Left AC per this TRN on arrival , labs drawn at the same time per Verbal OK of Dr. Paola. Warm NS started prior to CT scan.   Wife at bedside on return from CT scan  Darice CHRISTELLA Rouleau  Trauma Response RN  Please call TRN at [phone] for further assistance.

## 2023-11-08 DIAGNOSIS — N179 Acute kidney failure, unspecified: Secondary | ICD-10-CM | POA: Diagnosis not present

## 2023-11-08 LAB — CBC
HCT: 37.7 % — ABNORMAL LOW (ref 39.0–52.0)
Hemoglobin: 13 g/dL (ref 13.0–17.0)
MCH: 31 pg (ref 26.0–34.0)
MCHC: 34.5 g/dL (ref 30.0–36.0)
MCV: 90 fL (ref 80.0–100.0)
Platelets: 254 K/uL (ref 150–400)
RBC: 4.19 MIL/uL — ABNORMAL LOW (ref 4.22–5.81)
RDW: 13.2 % (ref 11.5–15.5)
WBC: 5.8 K/uL (ref 4.0–10.5)
nRBC: 0 % (ref 0.0–0.2)

## 2023-11-08 LAB — COMPREHENSIVE METABOLIC PANEL WITH GFR
ALT: 43 U/L (ref 0–44)
AST: 82 U/L — ABNORMAL HIGH (ref 15–41)
Albumin: 3 g/dL — ABNORMAL LOW (ref 3.5–5.0)
Alkaline Phosphatase: 57 U/L (ref 38–126)
Anion gap: 7 (ref 5–15)
BUN: 21 mg/dL (ref 8–23)
CO2: 25 mmol/L (ref 22–32)
Calcium: 7.8 mg/dL — ABNORMAL LOW (ref 8.9–10.3)
Chloride: 107 mmol/L (ref 98–111)
Creatinine, Ser: 1.26 mg/dL — ABNORMAL HIGH (ref 0.61–1.24)
GFR, Estimated: >60 mL/min (ref >60)
Glucose, Bld: 111 mg/dL — ABNORMAL HIGH (ref 70–99)
Potassium: 3.4 mmol/L — ABNORMAL LOW (ref 3.5–5.1)
Sodium: 139 mmol/L (ref 135–145)
Total Bilirubin: 0.7 mg/dL (ref 0.0–1.2)
Total Protein: 5.5 g/dL — ABNORMAL LOW (ref 6.5–8.1)

## 2023-11-08 LAB — HIV ANTIBODY (ROUTINE TESTING W REFLEX): HIV Screen 4th Generation wRfx: NONREACTIVE

## 2023-11-08 MED ORDER — POTASSIUM CHLORIDE 20 MEQ PO PACK
40.0000 meq | PACK | Freq: Once | ORAL | Status: AC
  Administered 2023-11-08: 40 meq via ORAL
  Filled 2023-11-08: qty 2

## 2023-11-08 NOTE — Progress Notes (Signed)
 Pt/wife was wandering why patient was still NPO, since they where told that Neurosurgery would be to see him before 2000 and it was after that time and that was reason he was NPO until he was seen by them. Pt was wanting to eat, reached out to [facility] NP on call and she stated that patient needed to remain Npo until he was seen by Neurosurgery. Made them aware of what Np said. Patient was not in agreement with staying NPO. He later asked for ice chips and I reached back out NP who again advised for patient to remain NPO until he's seen  by Neurosurgery.Patient stated that his mouth was extremely, I expressed to him that I could give him some swabs with a little bit of water to swab his mouth out.He sighed, when I returned to room with items, he expressed that me and former nurse told him 2 different things. She said that he could have ice chips. I explained to him that I don't know what the former nurse told him but I was going by the doctor's orders and that I had reached out twice resulted in the same thing to remain NPO. He spoke in loud tone and said I'm not going to sit him and allow you argue with'. I expressed to I'm that I don't have anything to argue with him about. He through up his hand in manner to shush  me up, then told me to get the hell out his room. I left out room and notified Charge Nurse of the above activities.

## 2023-11-08 NOTE — Plan of Care (Signed)

## 2023-11-08 NOTE — Plan of Care (Signed)
   Problem: Health Behavior/Discharge Planning: Goal: Ability to manage health-related needs will improve Outcome: Progressing

## 2023-11-08 NOTE — Progress Notes (Addendum)
 PROGRESS NOTE    Mike Edwards  FMW:[id number] DOB: 10/28/1960 DOA: 11/07/2023 PCP: Katrinka Garnette KIDD, MD  Brief Narrative: This 63 yrs old Male with PMH significant for hyperlipidemia, GERD presenting after stack of plywood fell on him. Patient was in his workshop and a stack of plywood fell on him approximately 1200 pounds.He was under the stack for around 30 minutes before he could be extracted.  He did report head injury with brief unresponsive episode.  It appears his watch likely detected the fall and called EMS.  Lab workup include bicarb 10, gap 27, creatinine 1.64, AST 52.  Lactic acid initially elevated to 15 which normalized on repeat labs.  Ethanol level negative.  Trauma imaging included chest x-ray,  pelvic x-ray,  left elbow x-ray , left foot x-ray >  no acute abnormality.  Right foot x-ray showed minimally displaced fracture of distal third metatarsal.  CT head shows soft tissue injury but no intracranial abnormality or skull fracture.  CT C-spine showed widespread cervical spinal canal hemorrhage of unclear etiology with subcutaneous hematoma at C6-C7.  CT chest abdomen pelvis shows no acute abnormality.  MRI C-spine showed Subarachnoid hemorrhage within the spinal canal from C3 through C7.  Neurosurgery is consulted.  Assessment & Plan:   Principal Problem:   AKI (acute kidney injury) Active Problems:   Crush injury  Acute kidney injury: Creatinine elevated 1.64 from baseline 1.0. Possibly related to traumatic event though not necessarily.   Lactic acid initially elevated but has since cleared with IV fluids.   CK not elevated.  Urinalysis pending. AKI improving with IV hydration.1.64 >1.40 >1.26  Crush injury: Cervical hemorrhage; Subarachnoid hemorrhage; Patient fell along the garden about 12 pounds of plywood landed on and around the patient. CT C spine > hemorrhage in the cervical spinal canal without clear etiology.   MRI C spine showed Subarachnoid hemorrhage  within the spinal canal from C3 through C7. Other traumatic injury noted was soft tissue scalp injury and right foot minimally displaced fracture of the third metatarsal. Trauma surgery consulted and are following. Neurosurgery is consulted, recommended no neurosurgical evaluation needed at this time.  Follow-up outpatient.   Metatarsal fracture; Orthopedic surgery consulted and recommended cam boot or postop shoe for left foot fracture. Adequate pain control.   Hyperlipidemia: GERD: Not currently taking medication for this .   DVT prophylaxis: SCDS Code Status: Full Family Communication: Wife at bed side Disposition Plan:    Status is: Observation The patient remains OBS appropriate and will d/c before 2 midnights.  Admitted as trauma, MRI shows subarachnoid hemorrhage,  neurosurgery is consulted.    Consultants:  Neurosurgery Trauma surgery  Procedures: MRI C spine  Antimicrobials:  Anti-infectives (From admission, onward)    None      Subjective: Patient was seen and examined at bedside. Overnight events noted. Patient remains in cervical collar , states he is very hungry,  has not eaten in 48 hours. He reports no pain in the right foot but reports significant pain in the cervical area.  Objective: Vitals:   11/07/23 1725 11/07/23 2005 11/08/23 0419 11/08/23 0717  BP: (!) 136/92 130/86 112/86 (!) 124/90  Pulse: 93 76 61 67  Resp: 16 17 17 16   Temp: 98.5 F (36.9 C) 98.6 F (37 C) 98.4 F (36.9 C) 97.9 F (36.6 C)  TempSrc: Oral   Oral  SpO2: 97% 100% 98% 99%  Weight:      Height:        Intake/Output  Summary (Last 24 hours) at 11/08/2023 1051 Last data filed at 11/08/2023 0700 Gross per 24 hour  Intake 2000 ml  Output 300 ml  Net 1700 ml   Filed Weights   11/07/23 1119  Weight: 95.3 kg    Examination:  General exam: Appears calm and comfortable, not in any acute distress, in cervical collar. Respiratory system: CTA Bilaterally. Respiratory  effort normal. RR 16 Cardiovascular system: S1 & S2 heard, RRR. No JVD, murmurs, rubs, gallops or clicks.  Gastrointestinal system: Abdomen is non distended, soft and nontender. Normal bowel sounds heard. Central nervous system: Alert and oriented x 3. No focal neurological deficits. Extremities: Right foot swollen, mild tenderness noted. Skin: No rashes, lesions or ulcers Psychiatry: Judgement and insight appear normal. Mood & affect appropriate.     Data Reviewed: I have personally reviewed following labs and imaging studies  CBC: Recent Labs  Lab 11/07/23 1123 11/07/23 1129 11/08/23 0340  WBC 8.2  --  5.8  HGB 15.0 15.6 13.0  HCT 46.5 46.0 37.7*  MCV 94.7  --  90.0  PLT 360  --  254   Basic Metabolic Panel: Recent Labs  Lab 11/07/23 1123 11/07/23 1129 11/08/23 0340  NA 138 137 139  K 4.2 4.3 3.4*  CL 101 107 107  CO2 10*  --  25  GLUCOSE 252* 238* 111*  BUN 19 20 21   CREATININE 1.64* 1.40* 1.26*  CALCIUM 8.9  --  7.8*   GFR: Estimated Creatinine Clearance: 70 mL/min (A) (by C-G formula based on SCr of 1.26 mg/dL (H)). Liver Function Tests: Recent Labs  Lab 11/07/23 1123 11/08/23 0340  AST 52* 82*  ALT 31 43  ALKPHOS 75 57  BILITOT 0.8 0.7  PROT 6.7 5.5*  ALBUMIN 3.6 3.0*   No results for input(s): LIPASE, AMYLASE in the last 168 hours. No results for input(s): AMMONIA in the last 168 hours. Coagulation Profile: Recent Labs  Lab 11/07/23 1123  INR 1.0   Cardiac Enzymes: Recent Labs  Lab 11/07/23 1123  CKTOTAL 348  CKMB 12.4*   BNP (last 3 results) No results for input(s): PROBNP in the last 8760 hours. HbA1C: No results for input(s): HGBA1C in the last 72 hours. CBG: No results for input(s): GLUCAP in the last 168 hours. Lipid Profile: No results for input(s): CHOL, HDL, LDLCALC, TRIG, CHOLHDL, LDLDIRECT in the last 72 hours. Thyroid Function Tests: No results for input(s): TSH, T4TOTAL, FREET4, T3FREE,  THYROIDAB in the last 72 hours. Anemia Panel: No results for input(s): VITAMINB12, FOLATE, FERRITIN, TIBC, IRON, RETICCTPCT in the last 72 hours. Sepsis Labs: Recent Labs  Lab 11/07/23 1130 11/07/23 1457  LATICACIDVEN >15.0* 1.2    No results found for this or any previous visit (from the past 240 hours).   Radiology Studies: DG Knee 2 Views Left Result Date: 11/07/2023 CLINICAL DATA:  Left knee pain, crush injury EXAM: LEFT KNEE - 1-2 VIEW COMPARISON:  None Available. FINDINGS: Frontal and cross-table lateral views of the left knee are obtained. No acute displaced fracture, subluxation, or dislocation. Mild medial compartmental joint space narrowing. Small suprapatellar joint effusion. Soft tissues are otherwise unremarkable. IMPRESSION: 1. Small joint effusion. 2. No acute displaced fracture. 3. Mild osteoarthritis. Electronically Signed   By: Ozell Daring M.D.   On: 11/07/2023 16:17   MR Cervical Spine Wo Contrast Result Date: 11/07/2023 EXAM: MRI CERVICAL SPINE WITHOUT CONTRAST 11/07/2023 02:25:02 PM TECHNIQUE: Multiplanar multisequence MRI of the cervical spine was performed. COMPARISON: CT of the cervical  spine 11/07/2023. CLINICAL HISTORY: Neck trauma, ligament injury suspected (Age >= 16y). FINDINGS: BONES AND ALIGNMENT: Normal alignment. Normal vertebral body heights. Endplate marrow changes are present anteriorly at the inferior endplate of C6 with some increased T2 signal in the disc space suggesting trauma and noncompressed fracture. Edema is present within the posterior ligamentous complex from C2 to C7. SPINAL CORD: Normal spinal cord size. Subarachnoid hemorrhage is again noted within the spinal canal from C3 through C7. No abnormal spinal cord signal. SOFT TISSUES: No paraspinal mass. Edema is present C2 through C7 and in the paraspinous musculature bilaterally, right greater than left. C2-C3: No significant disc herniation. No spinal canal stenosis or neural  foraminal narrowing. C3-C4: No significant disc herniation. No spinal canal stenosis or neural foraminal narrowing. C4-C5: A broad based disc osteophyte is asymmetric to the left with moderate to severe left foraminal stenosis. C5-C6: A broad based disc osteophyte is asymmetric to the left with moderate to severe left foraminal stenosis. C6-C7: A rightward disc protrusion is present with moderate central and severe right foraminal stenosis. C7-T1: No significant disc herniation. No spinal canal stenosis or neural foraminal narrowing. IMPRESSION: 1. Subarachnoid hemorrhage within the spinal canal from C3 through C7. 2. Endplate marrow changes at the inferior endplate of C6 with increased T2 signal in the disc space, suggesting traumatic noncompressed fracture. 3. Edema within the posterior ligamentous complex from C2 to C7 and in the paraspinous musculature bilaterally, right greater than left consistent with acute injury. 4. Rightward disc protrusion at C6-C7 with moderate central and severe right foraminal stenosis. 5. Broad based disc osteophytes at C4-C5 and C5-C6 asymmetric to the left with moderate to severe left foraminal stenosis. Electronically signed by: Lonni Necessary MD 11/07/2023 03:07 PM EST RP Workstation: [id number]   DG Foot 2 Views Left Result Date: 11/07/2023 CLINICAL DATA:  Crush injury with left foot pain. EXAM: LEFT FOOT - 2 VIEW COMPARISON:  None Available. FINDINGS: Moderate degenerative changes of the first MTP joint. No acute fracture or dislocation. Small inferior calcaneal spur. Soft tissues unremarkable. IMPRESSION: 1. No acute findings. 2. Moderate degenerative changes of the first MTP joint. Electronically Signed   By: Toribio Agreste M.D.   On: 11/07/2023 13:06   DG Elbow 2 Views Left Result Date: 11/07/2023 CLINICAL DATA:  Trauma to left elbow. EXAM: LEFT ELBOW - 2 VIEW COMPARISON:  None Available. FINDINGS: There is no evidence of fracture, dislocation, or joint effusion.  There is no evidence of arthropathy or other focal bone abnormality. Soft tissues are unremarkable. IMPRESSION: Negative. Electronically Signed   By: Toribio Agreste M.D.   On: 11/07/2023 13:05   DG Foot 2 Views Right Result Date: 11/07/2023 CLINICAL DATA:  Crush injury to right foot. EXAM: RIGHT FOOT - 2 VIEW COMPARISON:  None Available. FINDINGS: Exam demonstrates a minimally displaced oblique fracture through the distal shaft of the third metatarsal. Mild degenerate changes over the first MTP joint. Minimal degenerative change of the midfoot. Small inferior calcaneal spur. Soft tissues are unremarkable. IMPRESSION: Minimally displaced oblique fracture through the distal shaft of the third metatarsal. Electronically Signed   By: Toribio Agreste M.D.   On: 11/07/2023 13:05   CT Cervical Spine Wo Contrast Result Date: 11/07/2023 EXAM: CT CERVICAL SPINE WITHOUT CONTRAST 11/07/2023 11:40:00 AM TECHNIQUE: CT of the cervical spine was performed without the administration of intravenous contrast. Multiplanar reformatted images are provided for review. Automated exposure control, iterative reconstruction, and/or weight based adjustment of the mA/kV was utilized to reduce the  radiation dose to as low as reasonably achievable. COMPARISON: Head and Chest CT 11/07/2023, reported separately. CLINICAL HISTORY: 63 year old male with neck trauma. FINDINGS: CERVICAL SPINE: BONES AND ALIGNMENT: Maintained cervical lordosis. No acute fracture or traumatic malalignment. posterior elements appear intact. DEGENERATIVE CHANGES: Chronic disc and endplate degeneration asymmetric to the left at C3-C4, and more circumferential at C6-C7. At least moderate degenerative spinal stenosis results at the latter. SOFT TISSUES: Abnormal circumferential increased density within the spinal canal surrounding the spinal cord (series 9 image 44 at the C2-C3 level), resembles traumatic hemorrhage within the canal. This abates at the cervicomedullary  junction. Visible posterior fossa appears negative. Prevertebral soft tissue contours remain normal. Broad based posterior subcutaneous hematoma/contusion to the left of midline overlying the lower cervical spine levels, centered at C6-C7. No soft tissue gas identified. IMPRESSION: 1. Evidence of widespread cervical spinal canal hemorrhage, although origin unclear. No cervical fracture, dislocation, or CT evidence of ligamentous injury. Non-contrast MRI may be valuable. 2. Broad-based posterior subcutaneous hematoma/contusion overlying the lower cervical spine centered at C6-C7. 3. Degenerative changes with at least moderate spinal stenosis at C6-C7. 4. Study reviewed in person with Dr. Paola at 11:50 AM today. Electronically signed by: Helayne Hurst MD 11/07/2023 12:06 PM EST RP Workstation: [id number]   CT CHEST ABDOMEN PELVIS W CONTRAST Result Date: 11/07/2023 CLINICAL DATA:  Blunt poly trauma, trapped under 1200 pounds of plywood. EXAM: CT CHEST, ABDOMEN, AND PELVIS WITH CONTRAST TECHNIQUE: Multidetector CT imaging of the chest, abdomen and pelvis was performed following the standard protocol during bolus administration of intravenous contrast. RADIATION DOSE REDUCTION: This exam was performed according to the departmental dose-optimization program which includes automated exposure control, adjustment of the mA and/or kV according to patient size and/or use of iterative reconstruction technique. CONTRAST:  75mL OMNIPAQUE IOHEXOL 350 MG/ML SOLN COMPARISON:  None Available. FINDINGS: CT CHEST FINDINGS Cardiovascular: Atherosclerotic calcification of the aortic valve. Heart is at the upper limits of normal in size to mildly enlarged. No pericardial effusion. Mediastinum/Nodes: No pathologically enlarged mediastinal, hilar or axillary lymph nodes. Esophagus is grossly unremarkable. Lungs/Pleura: Mild dependent atelectasis. Lungs are otherwise clear. No pleural fluid or pneumothorax. Airway is unremarkable.  Musculoskeletal: Degenerative changes in the spine.  No fracture. CT ABDOMEN PELVIS FINDINGS Hepatobiliary: Tiny cysts in the right hepatic lobe. No specific follow-up necessary. Liver and gallbladder are otherwise unremarkable. No biliary ductal dilatation. Pancreas: Negative. Spleen: Negative. Adrenals/Urinary Tract: Adrenal glands are unremarkable. Low-attenuation lesions in kidneys. No specific follow-up necessary. Kidneys are otherwise unremarkable. Ureters are decompressed. Bladder is low in volume. Stomach/Bowel: Stomach, small bowel, appendix and colon are unremarkable. Vascular/Lymphatic: Vascular structures are unremarkable. No pathologically enlarged lymph nodes. Reproductive: Prostate is normal in size. Other: Small right inguinal hernia contains fat. Mesenteries and peritoneum are unremarkable. No free fluid. Musculoskeletal: No fracture. Degenerative changes in the spine. Bilateral L5 pars defects with grade 1 anterolisthesis. IMPRESSION: 1. No evidence of acute trauma. 2. 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 3.  Aortic atherosclerosis (ICD10-I70.0). Electronically Signed   By: Newell Eke M.D.   On: 11/07/2023 12:02   CT HEAD WO CONTRAST Result Date: 11/07/2023 EXAM: CT HEAD WITHOUT CONTRAST 11/07/2023 11:40:00 AM TECHNIQUE: CT of the head was performed without the administration of intravenous contrast. Automated exposure control, iterative reconstruction, and/or weight based adjustment of the mA/kV was utilized to reduce the radiation dose to as low as reasonably achievable. COMPARISON: None available. CLINICAL HISTORY: 63 year old male status post crush injury.  FINDINGS: BRAIN AND VENTRICLES: No acute hemorrhage. No evidence of acute infarct. No hydrocephalus. No extra-axial collection. No mass effect or midline shift. Normal for age brain volume. No suspicious intracranial vascular hyperdensity. ORBITS: Orbital soft tissues appear  negative. SINUSES: Mild bilateral paranasal sinus inflammation. SOFT TISSUES AND SKULL: Left posterior convexity broad based scalp soft tissue hematoma and contusion. No scalp soft tissue gas. Underlying calvarium appears intact. No skull fracture identified. Middle ears and mastoids well aerated. Study reviewed in person with Dr. Paola at 11:50 AM today. IMPRESSION: 1. Scalp soft tissue injury. No skull fracture identified. 2. Normal for age non contrast CT appearance of the brain. 3. Study reviewed in person with Dr. Paola at 11:50 AM today. Electronically signed by: Helayne Hurst MD 11/07/2023 12:01 PM EST RP Workstation: [id number]   DG Chest Port 1 View Result Date: 11/07/2023 CLINICAL DATA:  Possible crush injury. EXAM: PORTABLE CHEST 1 VIEW COMPARISON:  12/22/2014 FINDINGS: Lungs are adequately inflated and otherwise clear. Cardiomediastinal silhouette is normal. No acute fracture. IMPRESSION: No active disease. Electronically Signed   By: Toribio Agreste M.D.   On: 11/07/2023 12:00   DG Pelvis Portable Result Date: 11/07/2023 CLINICAL DATA:  Trauma.  Possible crush injury. EXAM: PORTABLE PELVIS 1-2 VIEWS COMPARISON:  None Available. FINDINGS: No evidence of fracture or dislocation over the hips or pelvis. Mild degenerate change of the spine. Several pelvic phleboliths. IMPRESSION: No acute findings. Electronically Signed   By: Toribio Agreste M.D.   On: 11/07/2023 11:59   Scheduled Meds:  ibuprofen  600 mg Oral QID   methocarbamol  1,000 mg Oral Q8H   potassium chloride  40 mEq Oral Once   sodium chloride flush  3 mL Intravenous Q12H   Continuous Infusions:   LOS: 0 days    Time spent: 50 mins    Darcel Dawley, MD [facility]   If 7PM-7AM, please contact night-coverage

## 2023-11-08 NOTE — Progress Notes (Signed)
 PT Cancellation Note  Patient Details Name: Mike Edwards MRN: [id number] DOB: 11-17-1960   Cancelled Treatment:    Reason Eval/Treat Not Completed: Patient not medically ready (PT consult appreciated and chart reviewed. Pt pending Neurosurgery consult. Additionally, awaiting clarification on pt's RLE weight-bearing status. Will follow-up for PT evaluation once appropriate and as schedule permits.)  Randall SAUNDERS, PT, DPT Acute Rehabilitation Services Office: [phone] Secure Chat Preferred  Mike Edwards 11/08/2023, 7:21 AM

## 2023-11-08 NOTE — Care Management Obs Status (Signed)
 MEDICARE OBSERVATION STATUS NOTIFICATION   Patient Details  Name: OAKLAN PERSONS MRN: [id number] Date of Birth: 1960-11-09   Medicare Observation Status Notification Given:  Yes    Bridget Cordella Simmonds, LCSW 11/08/2023, 2:09 PM

## 2023-11-08 NOTE — Progress Notes (Signed)
 OT Cancellation Note  Patient Details Name: Mike Edwards MRN: [id number] DOB: 1960-07-30   Cancelled Treatment:    Reason Eval/Treat Not Completed: Other (comment) OT consult appreciated and chart reviewed. Pt is pending Neurosurgery consult and awaiting clarification on RLE WB status. OT to follow-up for evaluation as appropriate and schedule allows.   Mike Edwards, OTR/LSABRA  [facility] Acute Rehabilitation  Office: [phone]   Mike Edwards 11/08/2023, 7:21 AM

## 2023-11-08 NOTE — TOC CAGE-AID Note (Signed)
 Transition of Care [facility]) - CAGE-AID Screening   Patient Details  Name: Mike Edwards MRN: [id number] Date of Birth: 01/15/1960  Transition of [facility]) CM/SW Contact:    [name], LCSW Phone Number: 11/08/2023, 9:24 AM   Clinical Narrative: No SA noted.   CAGE-AID Screening:    Have You Ever Felt You Ought to Cut Down on Your Drinking or Drug Use?: No Have People Annoyed You By Critizing Your Drinking Or Drug Use?: No Have You Felt Bad Or Guilty About Your Drinking Or Drug Use?: No Have You Ever Had a Drink or Used Drugs First Thing In The Morning to Steady Your Nerves or to Get Rid of a Hangover?: No CAGE-AID Score: 0  Substance Abuse Education Offered: No

## 2023-11-08 NOTE — Progress Notes (Signed)
 Pt quite upset when CSW spoke with pt for moon, wanting update, stating he is ready to leave.  Dr Leotis raker and will see pt.  Pt informed. Cathlyn Ferry, MSW, LCSW 11/6/20252:25 PM

## 2023-11-08 NOTE — Progress Notes (Signed)
 PT Cancellation Note  Patient Details Name: Mike Edwards MRN: [id number] DOB: 01-Mar-1960   Cancelled Treatment:    Reason Eval/Treat Not Completed: Patient not medically ready (Per Hospitalist, Neuro Surgery signed off. He does not need neurosurgical evaluation at this time. PT/OT entered pt's room to complete evaluation. Introduced self and role. Pt expressed frustration and multiple concerns regarding miscommunication, lack of care, and classification status in the hospital. RN, Charge Nurse, and MD all notified of the situtation. Awaiting clarification on C-spine regarding need for brace or precautions. Pt with hard collar in room. He reports removing it on his own and states no one has told him what is going on. Educated pt on cervical precautions for safety. No orders for cervical brace, cervical precautions, weight bearing status, or orthopedic footwear. Per ED note 11/5 - Ortho recommending postop shoe, weight-bear as tolerated and outpatient follow-up (this is in reference to pt's right foot). Pt requesting for his complaints to be elevated, Charge Nurse reported she would handle this. Will follow-up for PT evaluation as appropriate.)  Randall SAUNDERS, PT, DPT Acute Rehabilitation Services Office: [phone] Secure Chat Preferred  Mike Edwards 11/08/2023, 3:55 PM

## 2023-11-08 NOTE — Progress Notes (Addendum)
 OT Cancellation Note  Patient Details Name: Mike Edwards MRN: [id number] DOB: [birth year]   Cancelled Treatment:    Reason Eval/Treat Not Completed: Other (comment) Per Hospitalist, Neuro Surgery signed off. He does not need neurosurgical evaluation at this time. PT/OT entered pt's room to complete evaluation. Introduced self and role. Pt expressed frustration and multiple concerns regarding miscommunication, lack of care, and classification status in the hospital. RN, Charge Nurse, and MD all notified of the situtation. Awaiting clarification on C-spine regarding need for brace or precautions. Pt with hard collar in room. He reports removing it on his own and states no one has told him what is going on. Educated pt on cervical precautions for safety. No orders for cervical brace, cervical precautions, weight bearing status, or orthopedic footwear. Per ED note 11/5 - Ortho recommending postop shoe, weight-bear as tolerated and outpatient follow-up (this is in reference to pt's right foot). Pt requesting for his complaints to be elevated, Charge Nurse reported she would handle this. Will follow-up for OT evaluation as appropriate.   Mike Edwards, OTR/LSABRA  [facility] Acute Rehabilitation  Office: [phone]   Mike PARAS Shatarra Wehling 11/08/2023, 4:56 PM

## 2023-11-08 NOTE — Evaluation (Signed)
 Speech Language Pathology Evaluation Patient Details Name: Mike Edwards MRN: [id number] DOB: 1960/03/31 Today's Date: 11/08/2023 Time: 1022-1050 SLP Time Calculation (min) (ACUTE ONLY): 28 min  Problem List:  Patient Active Problem List   Diagnosis Date Noted   Crush injury 11/07/2023   AKI (acute kidney injury) 11/07/2023   Past Medical History: No past medical history on file. Past Surgical History: The histories are not reviewed yet. Please review them in the History navigator section and refresh this SmartLink. HPI:  Karem Tomaso is a 63 yo M who presented after stack of plywood fell on him. Patient was in his workshop and a stack of plywood fell on him approximately 1200 pounds.He was under the stack for around 30 minutes before he could be extracted.  He did report head injury with brief unresponsive episode.  Head CT 11/5 with no acute findings.  Pt with PMH significant for hyperlipidemia, GERD.   Assessment / Plan / Recommendation Clinical Impression  Pt presents with functional cogntive linguistic abilities and denies any changes to cognition with this hospitalization. Pt was assessed using the COGNISTAT (see below for additional information).  Pt performed within the average range on all subtests except for memory.  Pt reports performance today is consistent with recall prior to admission.  Visuospatial construction was assessed using a clock drawing which was completed in a timely manner with no inaccuracies.  Couseled pt to speak with provider if he should have difficulty resuming ADLs.  Pt has no further ST needs.  SLP will sign off at this time.  COGNISTAT: All subtests are within the average range, except where otherwise specified.  Orientation:  12/12 Attention: 8/8 Comprehension: 6/6 Repetition: 12/12 Naming: 8/8 Construction: clock drawing Memory: 6/12, moderate impairment Calculations: 4/4 Similarities: 7/8 Judgment: 6/6     SLP Assessment  SLP  Recommendation/Assessment: Patient does not need any further Speech Language Pathology Services SLP Visit Diagnosis: Cognitive communication deficit (R41.841)     Assistance Recommended at Discharge  None  Functional Status Assessment Patient has not had a recent decline in their functional status  Frequency and Duration   N/A        SLP Evaluation Cognition  Overall Cognitive Status: Within Functional Limits for tasks assessed Arousal/Alertness: Awake/alert Orientation Level: Oriented X4 Year: 2025 Month: November Day of Week: Correct Attention: Focused;Sustained Focused Attention: Appears intact Sustained Attention: Appears intact Memory: Impaired Awareness: Appears intact Problem Solving: Appears intact Executive Function: Reasoning;Organizing Reasoning: Appears intact Organizing: Appears intact       Comprehension  Auditory Comprehension Overall Auditory Comprehension: Appears within functional limits for tasks assessed Commands: Within Functional Limits Conversation: Complex Visual Recognition/Discrimination Discrimination: Not tested Reading Comprehension Reading Status: Not tested    Expression Expression Primary Mode of Expression: Verbal Verbal Expression Overall Verbal Expression: Appears within functional limits for tasks assessed Initiation: No impairment Level of Generative/Spontaneous Verbalization: Conversation Repetition: No impairment Naming: No impairment Pragmatics: No impairment Written Expression Dominant Hand: Right Written Expression: Not tested   Oral / Motor  Motor Speech Overall Motor Speech: Appears within functional limits for tasks assessed Respiration: Within functional limits Resonance: Within functional limits Articulation: Within functional limitis Intelligibility: Intelligible Motor Planning: Within functional limits Motor Speech Errors: Not applicable            Anette FORBES Grippe, MA, CCC-SLP Acute Rehabilitation  Services Office: [phone] 11/08/2023, 11:04 AM

## 2023-11-08 NOTE — Progress Notes (Signed)
 Patient NPO for possible surgery. Patient is very frustrated that he cannot eat. I have reached out to provider Dr Leotis who has confirmed that patient is not going for surgery. Diet ordered. Patient was able to order a meal.

## 2023-11-09 DIAGNOSIS — N179 Acute kidney failure, unspecified: Secondary | ICD-10-CM | POA: Diagnosis not present

## 2023-11-09 LAB — COMPREHENSIVE METABOLIC PANEL WITH GFR
ALT: 41 U/L (ref 0–44)
AST: 63 U/L — ABNORMAL HIGH (ref 15–41)
Albumin: 3 g/dL — ABNORMAL LOW (ref 3.5–5.0)
Alkaline Phosphatase: 58 U/L (ref 38–126)
Anion gap: 9 (ref 5–15)
BUN: 22 mg/dL (ref 8–23)
CO2: 22 mmol/L (ref 22–32)
Calcium: 7.8 mg/dL — ABNORMAL LOW (ref 8.9–10.3)
Chloride: 104 mmol/L (ref 98–111)
Creatinine, Ser: 1.06 mg/dL (ref 0.61–1.24)
GFR, Estimated: >60 mL/min (ref >60)
Glucose, Bld: 109 mg/dL — ABNORMAL HIGH (ref 70–99)
Potassium: 4 mmol/L (ref 3.5–5.1)
Sodium: 135 mmol/L (ref 135–145)
Total Bilirubin: 0.7 mg/dL (ref 0.0–1.2)
Total Protein: 5.5 g/dL — ABNORMAL LOW (ref 6.5–8.1)

## 2023-11-09 LAB — CBC
HCT: 36.9 % — ABNORMAL LOW (ref 39.0–52.0)
Hemoglobin: 12.6 g/dL — ABNORMAL LOW (ref 13.0–17.0)
MCH: 30.7 pg (ref 26.0–34.0)
MCHC: 34.1 g/dL (ref 30.0–36.0)
MCV: 90 fL (ref 80.0–100.0)
Platelets: 242 K/uL (ref 150–400)
RBC: 4.1 MIL/uL — ABNORMAL LOW (ref 4.22–5.81)
RDW: 13.3 % (ref 11.5–15.5)
WBC: 6.8 K/uL (ref 4.0–10.5)
nRBC: 0 % (ref 0.0–0.2)

## 2023-11-09 LAB — PHOSPHORUS: Phosphorus: 2.5 mg/dL (ref 2.5–4.6)

## 2023-11-09 LAB — MAGNESIUM: Magnesium: 1.9 mg/dL (ref 1.7–2.4)

## 2023-11-09 MED ORDER — METHOCARBAMOL 1000 MG PO TABS: 1000.0000 mg | ORAL_TABLET | Freq: Three times a day (TID) | ORAL | 0 refills | Status: AC

## 2023-11-09 MED ORDER — OXYCODONE HCL 5 MG PO TABS: 5.0000 mg | ORAL_TABLET | Freq: Three times a day (TID) | ORAL | 0 refills | Status: DC | PRN

## 2023-11-09 MED ORDER — IBUPROFEN 600 MG PO TABS: 600.0000 mg | ORAL_TABLET | Freq: Four times a day (QID) | ORAL | 0 refills | Status: DC

## 2023-11-09 NOTE — Plan of Care (Signed)
  Problem: Education: Goal: Knowledge of General Education information will improve Description: Including pain rating scale, medication(s)/side effects and non-pharmacologic comfort measures Outcome: Progressing   Problem: Health Behavior/Discharge Planning: Goal: Ability to manage health-related needs will improve Outcome: Progressing   Problem: Clinical Measurements: Goal: Ability to maintain clinical measurements within normal limits will improve Outcome: Progressing Goal: Will remain free from infection Outcome: Progressing Goal: Diagnostic test results will improve Outcome: Progressing Goal: Respiratory complications will improve Outcome: Progressing Goal: Cardiovascular complication will be avoided Outcome: Progressing   Problem: Activity: Goal: Risk for activity intolerance will decrease Outcome: Progressing   Problem: Elimination: Goal: Will not experience complications related to bowel motility Outcome: Progressing Goal: Will not experience complications related to urinary retention Outcome: Progressing   

## 2023-11-09 NOTE — Evaluation (Signed)
 Physical Therapy Evaluation Patient Details Name: Mike Edwards MRN: [id number] DOB: Jul 12, 1960 Today's Date: 11/09/2023  History of Present Illness  63 y.o. male presenting to [facility] on 11/5 after stack of plywood fell on him and he fell, hitting his head. Right foot x-ray showed minimally displaced fracture of the distal third metatarsal, CT head showed soft tissue injury but no acute intracranial abnormality or skull fracture, and CT C-spine showed widespread cervical spinal canal hemorrhage of unclear etiology with subcutaneous hematoma at C6-C7. MRI C spine showed Subarachnoid hemorrhage within the spinal canal from C3 through C7 and ?traumatic noncompressed fracture at endplate of C6. PMH: HLD and GERD.   Clinical Impression  Pt admitted with above diagnosis. PTA, pt was independent with functional mobility, ADLs/IADLs, driving, and completing wood working projects. He lives with his wife in a split level house with 7 STE and 5 steps to the bedroom and full bathroom. Pt currently with functional limitations due to the deficits listed below (see PT Problem List). He required CGA for transfers and gait using RW. Pt is currently limited by pain (L knee > R foot), decreased balance, and reduced activity tolerance. He demonstrated multiple gait abnormalities in attempts to compensate for L knee pain. Educated pt on BLE exercises to maintain ROM/strength. Encouraged use of ice and elevation. Pt will benefit from acute skilled PT to increase his independence and safety with mobility to allow discharge. Recommend OPPT to increase ROM/strength, improve balance, decrease fall risk, optimize safety, and restore PLOF.    If plan is discharge home, recommend the following: A little help with walking and/or transfers;A little help with bathing/dressing/bathroom;Assistance with cooking/housework;Assist for transportation;Help with stairs or ramp for entrance   Can travel by private vehicle         Equipment Recommendations Rolling walker (2 wheels)  Recommendations for Other Services       Functional Status Assessment Patient has had a recent decline in their functional status and demonstrates the ability to make significant improvements in function in a reasonable and predictable amount of time.     Precautions / Restrictions Precautions Precautions: Fall Recall of Precautions/Restrictions: Intact Required Braces or Orthoses: Other Brace Other Brace: R post-op shoe Restrictions Weight Bearing Restrictions Per Provider Order: Yes RLE Weight Bearing Per Provider Order: Weight bearing as tolerated      Mobility  Bed Mobility               General bed mobility comments: Not assessed. Pt greeted seated EOB and returned there at end of session.    Transfers Overall transfer level: Needs assistance Equipment used: Rolling walker (2 wheels) Transfers: Sit to/from Stand, Bed to chair/wheelchair/BSC Sit to Stand: Contact guard assist   Step pivot transfers: Contact guard assist       General transfer comment: Introduced RW and educated pt on proper and safe use of AD. Cued proper hand/foot placement and sequencing. He powered up with light assist. Good eccentric control. Transferred to/from commode in bathroom.    Ambulation/Gait Ambulation/Gait assistance: Contact guard assist Gait Distance (Feet): 100 Feet Assistive device: Rolling walker (2 wheels) Gait Pattern/deviations: Step-to pattern, Decreased step length - right, Decreased step length - left, Decreased stride length, Decreased stance time - left, Decreased weight shift to left, Antalgic Gait velocity: decreased Gait velocity interpretation: <1.8 ft/sec, indicate of risk for recurrent falls   General Gait Details: Pt ambulated with a step-to pattern. He maintained upright posture with good proximity to RW. Pt demonstrated compensations  d/t L knee pain opting for excessive hip ER and minimal knee flex. He  chose to limit weight-acceptance on R foot to just his heel. Pt navigated room, bathroom, and hallway well without LOB.  Stairs Stairs: Yes Stairs assistance: Contact guard assist Stair Management: One rail Right, Step to pattern, Forwards, Sideways Number of Stairs: 10 General stair comments: Pt took each step one at a time. He ascended with RLE forwardds and descended with LLE sideways. Pt maintained LLE in ER and minimize knee flex. Heavy reliance on UE support on railing. Discussed how his wife should be positioned and how she could assist.  Wheelchair Mobility     Tilt Bed    Modified Rankin (Stroke Patients Only)       Balance Overall balance assessment: Needs assistance Sitting-balance support: No upper extremity supported, Feet supported Sitting balance-Leahy Scale: Normal     Standing balance support: Single extremity supported, Bilateral upper extremity supported, During functional activity Standing balance-Leahy Scale: Fair Standing balance comment: Able to maintain static stand without UE support; benefits from UE support in dynamic standing and mobility                             Pertinent Vitals/Pain Pain Assessment Pain Assessment: Faces Faces Pain Scale: Hurts even more Pain Location: L knee Pain Descriptors / Indicators: Aching, Discomfort, Grimacing, Guarding, Sore Pain Intervention(s): Monitored during session, Limited activity within patient's tolerance, Other (comment) (Educated pt on use of ice and elevation with him verbalizing understanding)    Home Living Family/patient expects to be discharged to:: Private residence Living Arrangements: Spouse/significant other Available Help at Discharge: Family;Available 24 hours/day Type of Home: House Home Access: Stairs to enter Entrance Stairs-Rails: Right;Left Entrance Stairs-Number of Steps: 7 Alternate Level Stairs-Number of Steps: 5 Home Layout: Two level;Bed/bath upstairs;Full bath on  main level (Split Level) Home Equipment: Cane - single point;Wheelchair - Forensic Psychologist (2 wheels);Crutches;Shower seat      Prior Function Prior Level of Function : Independent/Modified Independent;Driving             Mobility Comments: Indep. 1 fall after being hit by a stack of plywood. ADLs Comments: Indep. 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     Extremity/Trunk Assessment   Upper Extremity Assessment Upper Extremity Assessment: Defer to OT evaluation    Lower Extremity Assessment Lower Extremity Assessment: RLE deficits/detail;LLE deficits/detail RLE Deficits / Details: Pt in post-op shoe. Decreased ankle/foot ROM and strength, grossly 3-/5. RLE: Unable to fully assess due to pain RLE Sensation: WNL RLE Coordination: WNL LLE Deficits / Details: Pt with knee effusion. Decreased knee AROM. Pt resisted PROM attempts. Grossly 3-/5 strength. LLE: Unable to fully assess due to pain LLE Sensation: WNL LLE Coordination: WNL    Cervical / Trunk Assessment Cervical / Trunk Assessment: Other exceptions Cervical / Trunk Exceptions: widespread cervical spinal canal hemorrhage of unclear etiology with subcutaneous hematoma at C6-C7; Subarachnoid hemorrhage within the spinal canal from C3 through C7; ?traumatic noncompressed fracture at endplate of C6  Communication   Communication Communication: No apparent difficulties    Cognition Arousal: Alert Behavior During Therapy: WFL for tasks assessed/performed   PT - Cognitive impairments: No apparent impairments                       PT - Cognition Comments: Pt A,Ox4 Following commands: Intact       Cueing  Cueing Techniques: Verbal cues, Gestural cues, Visual cues     General Comments General comments (skin integrity, edema, etc.): Pt's wife present and supportive throughout session    Exercises     Assessment/Plan    PT Assessment Patient needs continued PT  services  PT Problem List Decreased strength;Decreased range of motion;Decreased activity tolerance;Decreased balance;Decreased mobility;Decreased knowledge of use of DME;Decreased safety awareness;Pain       PT Treatment Interventions DME instruction;Gait training;Stair training;Functional mobility training;Therapeutic activities;Therapeutic exercise;Balance training;Patient/family education    PT Goals (Current goals can be found in the Care Plan section)  Acute Rehab PT Goals Patient Stated Goal: Return Home and regain independence PT Goal Formulation: With patient/family Time For Goal Achievement: 11/23/23 Potential to Achieve Goals: Good    Frequency Min 2X/week     Co-evaluation   Reason for Co-Treatment: To address functional/ADL transfers;Complexity of the patient's impairments (multi-system involvement) PT goals addressed during session: Mobility/safety with mobility;Balance;Proper use of DME OT goals addressed during session: ADL's and self-care       AM-PAC PT 6 Clicks Mobility  Outcome Measure Help needed turning from your back to your side while in a flat bed without using bedrails?: A Little Help needed moving from lying on your back to sitting on the side of a flat bed without using bedrails?: A Little Help needed moving to and from a bed to a chair (including a wheelchair)?: A Little Help needed standing up from a chair using your arms (e.g., wheelchair or bedside chair)?: A Little Help needed to walk in hospital room?: A Little Help needed climbing 3-5 steps with a railing? : A Little 6 Click Score: 18    End of Session Equipment Utilized During Treatment: Gait belt Activity Tolerance: Patient tolerated treatment well Patient left: in bed;with call bell/phone within reach;with family/visitor present Nurse Communication: Mobility status PT Visit Diagnosis: Difficulty in walking, not elsewhere classified (R26.2);Other abnormalities of gait and mobility  (R26.89);Unsteadiness on feet (R26.81)    Time: 8699-8664 PT Time Calculation (min) (ACUTE ONLY): 35 min   Charges:   PT Evaluation $PT Eval Moderate Complexity: 1 Mod   PT General Charges $$ ACUTE PT VISIT: 1 Visit         Randall SAUNDERS, PT, DPT Acute Rehabilitation Services Office: [phone] Secure Chat Preferred  Delon CHRISTELLA Callander 11/09/2023, 2:17 PM

## 2023-11-09 NOTE — Consult Note (Signed)
 Neurosurgery Consult Note  Assessment:  63 y/o M w/ no significant hx who had a large pile of pallets fall on him 2 days ago, found to have C3-7 SAH, C6-7 marrow changes, C6-7 degenerative stenosis. Pt denies neck pain and myelopathy symptoms  Plan:  No collar needed SBP<160 Please hold all therapeutic anticoagulation and antiplatelets for 2 weeks from injury. If necessary, ok for DVT ppx on 11/12 Ok to discharge from nsgy standpoint. Will plan for outpatient follow-up. Pt counseled on myelopathy symptoms and neck pain which he should call our office for    CC: foot pain  HPI:     Patient is a 63 y.o. male w/ no significant hx who had a large pile of pallets fall on him 2 days ago. Currently he has pain in his upper thoracic region where he has abrasions and bruising. He has occasional neck pain which was made worse with the cervical collar and has improved significantly with the collar off. He denies current and pre-existing hand numbness, hand weakness, gait instability, radicular pain. He does not take blood thinners  MRI Cspine shows a subarachnoid hemorrhage from C3-7, C6-7 disc T2 hyperintensity and marrow changes suggesting a minor fracture plus a C6-7 disc osteophyte causing spinal cord compression, posterior ligamentous pan-cervical edema CT Cspine is relatively unremarkable CTCAP shows no traumatic spinal injury. 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 (Patient not taking: Reported on 11/07/2023)   Not Taking   rosuvastatin (CRESTOR) 10 MG tablet Take 10 mg by mouth at bedtime. (Patient not taking: Reported on 11/07/2023)   Not Taking   Not on File  Social History   Tobacco Use   Smoking status: Not on file    Smokeless tobacco: Not on file  Substance Use Topics   Alcohol use: Not on file    No family history on file.   Review of Systems Pertinent items are noted in HPI.  Objective:   Patient Vitals for the past 8 hrs:  BP Temp Temp src Pulse Resp SpO2  11/09/23 0753 (!) 141/100 97.9 F (36.6 C) Oral 69 18 99 %  11/09/23 0410 136/85 98.9 F (37.2 C) -- (!) 59 16 100 %   I/O last 3 completed shifts: In: 480 [P.O.:480] Out: 700 [Urine:700] No intake/output data recorded.    Exam: RUE: 5/5 deltoid, 5/5 bicep, 5/5 wrist extension, 5/5 tricep, 5/5 grip LUE: 5/5 deltoid, 5/5 bicep, 5/5 wrist extension, 5/5 tricep, 5/5 grip RLE: 5/5 IP, 5/5 quad, 5/5 ham, 5/5 DF, 5/5 EHL, 5/5 PF LLE: 5/5 IP, 5/5 quad, 5/5 ham, 5/5 DF, 5/5 EHL, 5/5 PF Sensation to light intact    Data ReviewCBC:  Lab Results  Component Value Date   WBC 6.8 11/09/2023   RBC 4.10 (L) 11/09/2023   BMP:  Lab Results  Component Value Date   GLUCOSE 109 (H) 11/09/2023   CO2 22 11/09/2023   BUN 22 11/09/2023   CREATININE 1.06 11/09/2023   CALCIUM 7.8 (L) 11/09/2023

## 2023-11-09 NOTE — Discharge Summary (Signed)
 Physician Discharge Summary  Mike Edwards FMW:[id number] DOB: 1960/12/31 DOA: 11/07/2023  PCP: Katrinka Garnette KIDD, MD  Admit date: 11/07/2023  Discharge date: 11/09/2023  Admitted From: Home  Disposition:Home Health Services.  Recommendations for Outpatient Follow-up:  Follow up with PCP in 1-2 weeks. Please obtain BMP/CBC in one week. Advised to follow-up with Neurosurgery as needed.  Home Health: None Equipment/Devices:None  Discharge Condition: Stable CODE STATUS:Full code Diet recommendation: Heart Healthy  Brief [facility] Course: This 63 yrs old Male with PMH significant for hyperlipidemia, GERD presenting after stack of plywood fell on him. Patient was in his workshop and a stack of plywood fell on him approximately 1200 pounds.He was under the stack for around 30 minutes before he could be extracted.  He did report head injury with brief unresponsive episode.  It appears his watch likely detected the fall and called EMS.  Lab workup include bicarb 10, gap 27, creatinine 1.64, AST 52. Lactic acid initially elevated to 15 which normalized on repeat labs.  Ethanol level negative.  Trauma imaging included chest x-ray,  pelvic x-ray,  left elbow x-ray , left foot x-ray >  no acute abnormality.  Right foot x-ray showed minimally displaced fracture of distal third metatarsal. CT head shows soft tissue injury but no intracranial abnormality or skull fracture.CT C-spine showed widespread cervical spinal canal hemorrhage of unclear etiology with subcutaneous hematoma at C6-C7.  CT chest abdomen pelvis shows no acute abnormality. MRI C-spine showed Subarachnoid hemorrhage within the spinal canal from C3 through C7.  Neurosurgery is consulted.  Neurosurgery recommended no acute intervention needed at this time.  No cervical collar needed.  Please hold therapeutic anticoagulation for 2 weeks from the injury.  Patient is cleared from neurosurgery to be discharged with outpatient  follow-up. Patient being discharged home.  Discharge Diagnoses:  Principal Problem:   AKI (acute kidney injury) Active Problems:   Crush injury  Acute kidney injury: > Resolved Creatinine elevated 1.64 from baseline 1.0. Possibly related to traumatic event though not necessarily.   Lactic acid initially elevated but has since cleared with IV fluids.   CK not elevated.  Urinalysis pending. AKI improving with IV hydration.1.64 >1.40 >1.26 > 1.06   Crush injury: Cervical hemorrhage; Subarachnoid hemorrhage; Patient fell along the garden about 12 pounds of plywood landed on and around the patient. CT C spine > hemorrhage in the cervical spinal canal without clear etiology.   MRI C spine showed Subarachnoid hemorrhage within the spinal canal from C3 through C7. Other traumatic injury noted was soft tissue scalp injury and right foot minimally displaced fracture of the third metatarsal. Trauma surgery consulted and are following. Neurosurgery is consulted, recommended no neurosurgical evaluation needed at this time.  Follow-up outpatient.   Metatarsal fracture; Orthopedic surgery consulted and recommended cam boot or postop shoe for left foot fracture. Adequate pain control.     Hyperlipidemia: GERD: Not currently taking medication for this .  Discharge Instructions  Discharge Instructions     Call MD for:  difficulty breathing, headache or visual disturbances   Complete by: As directed    Call MD for:  persistant dizziness or light-headedness   Complete by: As directed    Call MD for:  persistant nausea and vomiting   Complete by: As directed    Diet - low sodium heart healthy   Complete by: As directed    Diet general   Complete by: As directed    Discharge instructions   Complete by: As directed  Advised to follow-up with primary care physician in 1 week. Advised to follow-up with neurosurgery as needed.   Increase activity slowly   Complete by: As directed        Allergies as of 11/09/2023   Not on File      Medication List     STOP taking these medications    amLODipine 5 MG tablet Commonly known as: NORVASC   rosuvastatin 10 MG tablet Commonly known as: CRESTOR       TAKE these medications    ibuprofen 600 MG tablet Commonly known as: ADVIL Take 1 tablet (600 mg total) by mouth 4 (four) times daily.   Methocarbamol 1000 MG Tabs Take 1,000 mg by mouth 3 (three) times daily for 10 days.   oxyCODONE 5 MG immediate release tablet Commonly known as: Roxicodone Take 1 tablet (5 mg total) by mouth every 8 (eight) hours as needed for up to 3 days.        Follow-up Information     Darnella Dorn SAUNDERS, MD Follow up.   Specialty: Neurosurgery Why: If you experience ongoing neck pain, or if you develop symptoms of hand weakness, hand numbness, or walking imbalance, please call our office to arrange an appointment Contact information: [address], [address] [place] [address] [phone]         Katrinka Garnette KIDD, MD Follow up in 1 week(s).   Specialty: Family Medicine Contact information: [address] [place] [address] [phone]                Not on File  Consultations: Neurosurgery Trauma Surgery   Procedures/Studies: DG Knee 2 Views Left Result Date: 11/07/2023 CLINICAL DATA:  Left knee pain, crush injury EXAM: LEFT KNEE - 1-2 VIEW COMPARISON:  None Available. FINDINGS: Frontal and cross-table lateral views of the left knee are obtained. No acute displaced fracture, subluxation, or dislocation. Mild medial compartmental joint space narrowing. Small suprapatellar joint effusion. Soft tissues are otherwise unremarkable. IMPRESSION: 1. Small joint effusion. 2. No acute displaced fracture. 3. Mild osteoarthritis. Electronically Signed   By: Ozell Daring M.D.   On: 11/07/2023 16:17   MR Cervical Spine Wo Contrast Result Date: 11/07/2023 EXAM: MRI CERVICAL SPINE WITHOUT CONTRAST 11/07/2023  02:25:02 PM TECHNIQUE: Multiplanar multisequence MRI of the cervical spine was performed. COMPARISON: CT of the cervical spine 11/07/2023. CLINICAL HISTORY: Neck trauma, ligament injury suspected (Age >= 16y). FINDINGS: BONES AND ALIGNMENT: Normal alignment. Normal vertebral body heights. Endplate marrow changes are present anteriorly at the inferior endplate of C6 with some increased T2 signal in the disc space suggesting trauma and noncompressed fracture. Edema is present within the posterior ligamentous complex from C2 to C7. SPINAL CORD: Normal spinal cord size. Subarachnoid hemorrhage is again noted within the spinal canal from C3 through C7. No abnormal spinal cord signal. SOFT TISSUES: No paraspinal mass. Edema is present C2 through C7 and in the paraspinous musculature bilaterally, right greater than left. C2-C3: No significant disc herniation. No spinal canal stenosis or neural foraminal narrowing. C3-C4: No significant disc herniation. No spinal canal stenosis or neural foraminal narrowing. C4-C5: A broad based disc osteophyte is asymmetric to the left with moderate to severe left foraminal stenosis. C5-C6: A broad based disc osteophyte is asymmetric to the left with moderate to severe left foraminal stenosis. C6-C7: A rightward disc protrusion is present with moderate central and severe right foraminal stenosis. C7-T1: No significant disc herniation. No spinal canal stenosis or neural foraminal narrowing. IMPRESSION:  1. Subarachnoid hemorrhage within the spinal canal from C3 through C7. 2. Endplate marrow changes at the inferior endplate of C6 with increased T2 signal in the disc space, suggesting traumatic noncompressed fracture. 3. Edema within the posterior ligamentous complex from C2 to C7 and in the paraspinous musculature bilaterally, right greater than left consistent with acute injury. 4. Rightward disc protrusion at C6-C7 with moderate central and severe right foraminal stenosis. 5. Broad based  disc osteophytes at C4-C5 and C5-C6 asymmetric to the left with moderate to severe left foraminal stenosis. Electronically signed by: Lonni Necessary MD 11/07/2023 03:07 PM EST RP Workstation: [id number]   DG Foot 2 Views Left Result Date: 11/07/2023 CLINICAL DATA:  Crush injury with left foot pain. EXAM: LEFT FOOT - 2 VIEW COMPARISON:  None Available. FINDINGS: Moderate degenerative changes of the first MTP joint. No acute fracture or dislocation. Small inferior calcaneal spur. Soft tissues unremarkable. IMPRESSION: 1. No acute findings. 2. Moderate degenerative changes of the first MTP joint. Electronically Signed   By: Toribio Agreste M.D.   On: 11/07/2023 13:06   DG Elbow 2 Views Left Result Date: 11/07/2023 CLINICAL DATA:  Trauma to left elbow. EXAM: LEFT ELBOW - 2 VIEW COMPARISON:  None Available. FINDINGS: There is no evidence of fracture, dislocation, or joint effusion. There is no evidence of arthropathy or other focal bone abnormality. Soft tissues are unremarkable. IMPRESSION: Negative. Electronically Signed   By: Toribio Agreste M.D.   On: 11/07/2023 13:05   DG Foot 2 Views Right Result Date: 11/07/2023 CLINICAL DATA:  Crush injury to right foot. EXAM: RIGHT FOOT - 2 VIEW COMPARISON:  None Available. FINDINGS: Exam demonstrates a minimally displaced oblique fracture through the distal shaft of the third metatarsal. Mild degenerate changes over the first MTP joint. Minimal degenerative change of the midfoot. Small inferior calcaneal spur. Soft tissues are unremarkable. IMPRESSION: Minimally displaced oblique fracture through the distal shaft of the third metatarsal. Electronically Signed   By: Toribio Agreste M.D.   On: 11/07/2023 13:05   CT Cervical Spine Wo Contrast Result Date: 11/07/2023 EXAM: CT CERVICAL SPINE WITHOUT CONTRAST 11/07/2023 11:40:00 AM TECHNIQUE: CT of the cervical spine was performed without the administration of intravenous contrast. Multiplanar reformatted images are  provided for review. Automated exposure control, iterative reconstruction, and/or weight based adjustment of the mA/kV was utilized to reduce the radiation dose to as low as reasonably achievable. COMPARISON: Head and Chest CT 11/07/2023, reported separately. CLINICAL HISTORY: 62 year old male with neck trauma. FINDINGS: CERVICAL SPINE: BONES AND ALIGNMENT: Maintained cervical lordosis. No acute fracture or traumatic malalignment. posterior elements appear intact. DEGENERATIVE CHANGES: Chronic disc and endplate degeneration asymmetric to the left at C3-C4, and more circumferential at C6-C7. At least moderate degenerative spinal stenosis results at the latter. SOFT TISSUES: Abnormal circumferential increased density within the spinal canal surrounding the spinal cord (series 9 image 44 at the C2-C3 level), resembles traumatic hemorrhage within the canal. This abates at the cervicomedullary junction. Visible posterior fossa appears negative. Prevertebral soft tissue contours remain normal. Broad based posterior subcutaneous hematoma/contusion to the left of midline overlying the lower cervical spine levels, centered at C6-C7. No soft tissue gas identified. IMPRESSION: 1. Evidence of widespread cervical spinal canal hemorrhage, although origin unclear. No cervical fracture, dislocation, or CT evidence of ligamentous injury. Non-contrast MRI may be valuable. 2. Broad-based posterior subcutaneous hematoma/contusion overlying the lower cervical spine centered at C6-C7. 3. Degenerative changes with at least moderate spinal stenosis at C6-C7. 4. Study reviewed in person with  Dr. Paola at 11:50 AM today. Electronically signed by: Helayne Hurst MD 11/07/2023 12:06 PM EST RP Workstation: [id number]   CT CHEST ABDOMEN PELVIS W CONTRAST Result Date: 11/07/2023 CLINICAL DATA:  Blunt poly trauma, trapped under 1200 pounds of plywood. EXAM: CT CHEST, ABDOMEN, AND PELVIS WITH CONTRAST TECHNIQUE: Multidetector CT imaging of the  chest, abdomen and pelvis was performed following the standard protocol during bolus administration of intravenous contrast. RADIATION DOSE REDUCTION: This exam was performed according to the departmental dose-optimization program which includes automated exposure control, adjustment of the mA and/or kV according to patient size and/or use of iterative reconstruction technique. CONTRAST:  75mL OMNIPAQUE IOHEXOL 350 MG/ML SOLN COMPARISON:  None Available. FINDINGS: CT CHEST FINDINGS Cardiovascular: Atherosclerotic calcification of the aortic valve. Heart is at the upper limits of normal in size to mildly enlarged. No pericardial effusion. Mediastinum/Nodes: No pathologically enlarged mediastinal, hilar or axillary lymph nodes. Esophagus is grossly unremarkable. Lungs/Pleura: Mild dependent atelectasis. Lungs are otherwise clear. No pleural fluid or pneumothorax. Airway is unremarkable. Musculoskeletal: Degenerative changes in the spine.  No fracture. CT ABDOMEN PELVIS FINDINGS Hepatobiliary: Tiny cysts in the right hepatic lobe. No specific follow-up necessary. Liver and gallbladder are otherwise unremarkable. No biliary ductal dilatation. Pancreas: Negative. Spleen: Negative. Adrenals/Urinary Tract: Adrenal glands are unremarkable. Low-attenuation lesions in kidneys. No specific follow-up necessary. Kidneys are otherwise unremarkable. Ureters are decompressed. Bladder is low in volume. Stomach/Bowel: Stomach, small bowel, appendix and colon are unremarkable. Vascular/Lymphatic: Vascular structures are unremarkable. No pathologically enlarged lymph nodes. Reproductive: Prostate is normal in size. Other: Small right inguinal hernia contains fat. Mesenteries and peritoneum are unremarkable. No free fluid. Musculoskeletal: No fracture. Degenerative changes in the spine. Bilateral L5 pars defects with grade 1 anterolisthesis. IMPRESSION: 1. No evidence of acute trauma. 2. 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 3.  Aortic atherosclerosis (ICD10-I70.0). Electronically Signed   By: Newell Eke M.D.   On: 11/07/2023 12:02   CT HEAD WO CONTRAST Result Date: 11/07/2023 EXAM: CT HEAD WITHOUT CONTRAST 11/07/2023 11:40:00 AM TECHNIQUE: CT of the head was performed without the administration of intravenous contrast. Automated exposure control, iterative reconstruction, and/or weight based adjustment of the mA/kV was utilized to reduce the radiation dose to as low as reasonably achievable. COMPARISON: None available. CLINICAL HISTORY: 63 year old male status post crush injury. FINDINGS: BRAIN AND VENTRICLES: No acute hemorrhage. No evidence of acute infarct. No hydrocephalus. No extra-axial collection. No mass effect or midline shift. Normal for age brain volume. No suspicious intracranial vascular hyperdensity. ORBITS: Orbital soft tissues appear negative. SINUSES: Mild bilateral paranasal sinus inflammation. SOFT TISSUES AND SKULL: Left posterior convexity broad based scalp soft tissue hematoma and contusion. No scalp soft tissue gas. Underlying calvarium appears intact. No skull fracture identified. Middle ears and mastoids well aerated. Study reviewed in person with Dr. Paola at 11:50 AM today. IMPRESSION: 1. Scalp soft tissue injury. No skull fracture identified. 2. Normal for age non contrast CT appearance of the brain. 3. Study reviewed in person with Dr. Paola at 11:50 AM today. Electronically signed by: Helayne Hurst MD 11/07/2023 12:01 PM EST RP Workstation: [id number]   DG Chest Port 1 View Result Date: 11/07/2023 CLINICAL DATA:  Possible crush injury. EXAM: PORTABLE CHEST 1 VIEW COMPARISON:  12/22/2014 FINDINGS: Lungs are adequately inflated and otherwise clear. Cardiomediastinal silhouette is normal. No acute fracture. IMPRESSION: No active disease. Electronically Signed   By: Toribio Agreste M.D.   On: 11/07/2023 12:00   DG  Pelvis Portable Result Date:  11/07/2023 CLINICAL DATA:  Trauma.  Possible crush injury. EXAM: PORTABLE PELVIS 1-2 VIEWS COMPARISON:  None Available. FINDINGS: No evidence of fracture or dislocation over the hips or pelvis. Mild degenerate change of the spine. Several pelvic phleboliths. IMPRESSION: No acute findings. Electronically Signed   By: Toribio Agreste M.D.   On: 11/07/2023 11:59    Subjective: Patient was seen and examined at bedside.  Overnight events noted. Patient reports doing much better and  wants to be discharged home.  Discharge Exam: Vitals:   11/09/23 0410 11/09/23 0753  BP: 136/85 (!) 141/100  Pulse: (!) 59 69  Resp: 16 18  Temp: 98.9 F (37.2 C) 97.9 F (36.6 C)  SpO2: 100% 99%   Vitals:   11/08/23 1534 11/08/23 1915 11/09/23 0410 11/09/23 0753  BP: (!) 137/91 131/86 136/85 (!) 141/100  Pulse: 75 64 (!) 59 69  Resp: 16 15 16 18   Temp: 98.2 F (36.8 C) 98.2 F (36.8 C) 98.9 F (37.2 C) 97.9 F (36.6 C)  TempSrc: Oral   Oral  SpO2: 97% 97% 100% 99%  Weight:      Height:        General: Pt is alert, awake, not in acute distress Cardiovascular: RRR, S1/S2 +, no rubs, no gallops Respiratory: CTA bilaterally, no wheezing, no rhonchi Abdominal: Soft, NT, ND, bowel sounds + Extremities: no edema, no cyanosis    The results of significant diagnostics from this hospitalization (including imaging, microbiology, ancillary and laboratory) are listed below for reference.     Microbiology: No results found for this or any previous visit (from the past 240 hours).   Labs: BNP (last 3 results) No results for input(s): BNP in the last 8760 hours. Basic Metabolic Panel: Recent Labs  Lab 11/07/23 1123 11/07/23 1129 11/08/23 0340 11/09/23 0349  NA 138 137 139 135  K 4.2 4.3 3.4* 4.0  CL 101 107 107 104  CO2 10*  --  25 22  GLUCOSE 252* 238* 111* 109*  BUN 19 20 21 22   CREATININE 1.64* 1.40* 1.26* 1.06  CALCIUM 8.9  --  7.8* 7.8*  MG  --   --   --  1.9  PHOS  --   --   --  2.5    Liver Function Tests: Recent Labs  Lab 11/07/23 1123 11/08/23 0340 11/09/23 0349  AST 52* 82* 63*  ALT 31 43 41  ALKPHOS 75 57 58  BILITOT 0.8 0.7 0.7  PROT 6.7 5.5* 5.5*  ALBUMIN 3.6 3.0* 3.0*   No results for input(s): LIPASE, AMYLASE in the last 168 hours. No results for input(s): AMMONIA in the last 168 hours. CBC: Recent Labs  Lab 11/07/23 1123 11/07/23 1129 11/08/23 0340 11/09/23 0349  WBC 8.2  --  5.8 6.8  HGB 15.0 15.6 13.0 12.6*  HCT 46.5 46.0 37.7* 36.9*  MCV 94.7  --  90.0 90.0  PLT 360  --  254 242   Cardiac Enzymes: Recent Labs  Lab 11/07/23 1123  CKTOTAL 348  CKMB 12.4*   BNP: Invalid input(s): POCBNP CBG: No results for input(s): GLUCAP in the last 168 hours. D-Dimer No results for input(s): DDIMER in the last 72 hours. Hgb A1c No results for input(s): HGBA1C in the last 72 hours. Lipid Profile No results for input(s): CHOL, HDL, LDLCALC, TRIG, CHOLHDL, LDLDIRECT in the last 72 hours. Thyroid function studies No results for input(s): TSH, T4TOTAL, T3FREE, THYROIDAB in the last 72 hours.  Invalid  input(s): FREET3 Anemia work up No results for input(s): VITAMINB12, FOLATE, FERRITIN, TIBC, IRON, RETICCTPCT in the last 72 hours. Urinalysis No results found for: COLORURINE, APPEARANCEUR, LABSPEC, PHURINE, GLUCOSEU, HGBUR, BILIRUBINUR, KETONESUR, PROTEINUR, UROBILINOGEN, NITRITE, LEUKOCYTESUR Sepsis Labs Recent Labs  Lab 11/07/23 1123 11/08/23 0340 11/09/23 0349  WBC 8.2 5.8 6.8   Microbiology No results found for this or any previous visit (from the past 240 hours).   Time coordinating discharge: Over 30 minutes  SIGNED:   Darcel Dawley, MD  [facility] 11/09/2023, 2:36 PM Pager   If 7PM-7AM, please contact night-coverage

## 2023-11-09 NOTE — TOC CM/SW Note (Signed)
 1540 11-09-23 ICM spoke with patient regarding outpatient PT; patient declined services. RW ordered via [employer]. DME will be delivered to the room. No further needs identified at this time.

## 2023-11-09 NOTE — Discharge Instructions (Signed)
 Advised to follow-up with neurosurgery as needed.

## 2023-11-09 NOTE — Evaluation (Signed)
 Occupational Therapy Evaluation Patient Details Name: Mike Edwards MRN: [id number] DOB: Sep 23, 1960 Today's Date: 11/09/2023   History of Present Illness   63 y.o. male presenting to [facility] on 11/5 after stack of plywood fell on him and he fell, hitting his head. Right foot x-ray showed minimally displaced fracture of the distal third metatarsal, CT head showed soft tissue injury but no acute intracranial abnormality or skull fracture, and CT C-spine showed widespread cervical spinal canal hemorrhage of unclear etiology with subcutaneous hematoma at C6-C7. MRI C spine showed Subarachnoid hemorrhage within the spinal canal from C3 through C7 and ?traumatic noncompressed fracture at endplate of C6. PMH: HLD and GERD.     Clinical Impressions At baseline, pt is Independent with ADLs, IADLs, and functional mobility without an AD. Pt now presents with pain affecting functional level, decreased balance, decreased activity tolerance, decreased knowledge of AE/DME, and decreased safety and independence with functional tasks. Pt currently largely completing ADLs Independent to Contact guard assist and functional mobility/transfers with a RW with Contact guard assist for safety. Pt participated well in session and is motivated to return to PLOF. Pt will benefit from acute OT services to address deficits and increase safety and independence with functional tasks. No post acute OT needs are anticipated. OT recommends use of shower seat or tub bench in the home at least until R LE WB precautions are lifted for increased safety, improved energy conservation, and decreased pain.      If plan is discharge home, recommend the following:   A little help with walking and/or transfers;A little help with bathing/dressing/bathroom;Assistance with cooking/housework;Assist for transportation;Help with stairs or ramp for entrance     Functional Status Assessment   Patient has had a recent decline in their  functional status and demonstrates the ability to make significant improvements in function in a reasonable and predictable amount of time.     Equipment Recommendations   Other (comment) (RW; gait belt provided this session)     Recommendations for Other Services         Precautions/Restrictions   Precautions Precautions: Fall Recall of Precautions/Restrictions: Intact Required Braces or Orthoses: Other Brace Other Brace: R post-op shoe Restrictions Weight Bearing Restrictions Per Provider Order: Yes RLE Weight Bearing Per Provider Order: Weight bearing as tolerated     Mobility Bed Mobility               General bed mobility comments: Pt sitting at EOB at beginning and end of session    Transfers Overall transfer level: Needs assistance Equipment used: Rolling walker (2 wheels) Transfers: Sit to/from Stand, Bed to chair/wheelchair/BSC Sit to Stand: Contact guard assist     Step pivot transfers: Contact guard assist     General transfer comment: Contact guard assist for safety due to pain and mild balance deficits; no physical assist needed      Balance Overall balance assessment: Needs assistance Sitting-balance support: No upper extremity supported, Feet supported Sitting balance-Leahy Scale: Normal     Standing balance support: Single extremity supported, Bilateral upper extremity supported, During functional activity Standing balance-Leahy Scale: Fair Standing balance comment: Able to maintain static stand without UE support; benefits from UE support in dynamic standing and mobility                           ADL either performed or assessed with clinical judgement   ADL Overall ADL's : Needs assistance/impaired Eating/Feeding: Independent;Sitting  Grooming: Contact guard assist;Standing   Upper Body Bathing: Set up;Sitting   Lower Body Bathing: Supervison/ safety;Sitting/lateral leans;Contact guard assist;Sit to/from stand    Upper Body Dressing : Independent;Sitting   Lower Body Dressing: Supervision/safety;Sitting/lateral leans;Contact guard assist;Sit to/from stand   Toilet Transfer: Contact guard assist;Ambulation;Regular Toilet;Rolling walker (2 wheels) (cues for compensatory strategies)   Toileting- Clothing Manipulation and Hygiene: Set up;Sitting/lateral lean;Contact guard assist;Sit to/from stand       Functional mobility during ADLs: Contact guard assist;Rolling walker (2 wheels) (cues for technique with RW) General ADL Comments: Supervision to Contact guard assist for safety     Vision Baseline Vision/History: 1 Wears glasses Ability to See in Adequate Light: 0 Adequate (with glasses on) Patient Visual Report: No change from baseline Vision Assessment?: No apparent visual deficits (with glasses on)     Perception         Praxis         Pertinent Vitals/Pain Pain Assessment Pain Assessment: Faces Faces Pain Scale: Hurts even more Pain Location: L knee Pain Descriptors / Indicators: Aching, Discomfort, Grimacing, Guarding, Sore Pain Intervention(s): Limited activity within patient's tolerance, Monitored during session, Other (comment) (Pt educated in use of ice and elevating L LE with pt verbalizing understanding)     Extremity/Trunk Assessment Upper Extremity Assessment Upper Extremity Assessment: Right hand dominant;Overall WFL for tasks assessed   Lower Extremity Assessment Lower Extremity Assessment: Defer to PT evaluation   Cervical / Trunk Assessment Cervical / Trunk Assessment: Other exceptions Cervical / Trunk Exceptions: widespread cervical spinal canal hemorrhage of unclear etiology with subcutaneous hematoma at C6-C7; Subarachnoid hemorrhage within the spinal canal from C3 through C7; ?traumatic noncompressed fracture at endplate of C6   Communication Communication Communication: No apparent difficulties   Cognition Arousal: Alert Behavior During Therapy: WFL for  tasks assessed/performed Cognition: No apparent impairments             OT - Cognition Comments: PT AAOx4 and pleasant throughout session. [facility] for tasks assessed.                 Following commands: Intact       Cueing  General Comments   Cueing Techniques: Verbal cues;Gestural cues;Visual cues  Pt's wife present and supportive throughout session   Exercises     Shoulder Instructions      Home Living Family/patient expects to be discharged to:: Private residence Living Arrangements: Spouse/significant other Available Help at Discharge: Family;Available 24 hours/day Type of Home: House Home Access: Stairs to enter [employer] of Steps: 7 Entrance Stairs-Rails: Right;Left Home Layout: Two level;Bed/bath upstairs;Full bath on main level (Split Level) Alternate Level Stairs-Number of Steps: 5 Alternate Level Stairs-Rails: Left Bathroom Shower/Tub: Chief Strategy Officer: Standard     Home Equipment: Cane - single point;Wheelchair - Forensic Psychologist (2 wheels);Crutches;Shower seat          Prior Functioning/Environment Prior Level of Function : Independent/Modified Independent;Driving             Mobility Comments: Indep. 1 fall after being hit by wood pile. ADLs Comments: Indep. 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    OT Problem List: Impaired balance (sitting and/or standing);Decreased knowledge of use of DME or AE;Decreased knowledge of precautions;Pain   OT Treatment/Interventions: Self-care/ADL training;DME and/or AE instruction;Energy conservation;Therapeutic activities;Patient/family education      OT Goals(Current goals can be found in the care plan section)   Acute Rehab OT Goals Patient Stated Goal: to  return home OT Goal Formulation: With patient Time For Goal Achievement: 11/23/23 Potential to Achieve Goals: Good ADL Goals Pt Will Perform Grooming: with  modified independence;standing Pt Will Perform Lower Body Bathing: with modified independence;sit to/from stand;sitting/lateral leans Pt Will Perform Lower Body Dressing: with modified independence;sitting/lateral leans;sit to/from stand Pt Will Transfer to Toilet: with modified independence;ambulating;regular height toilet (with least restrictive AD) Pt Will Perform Tub/Shower Transfer: Tub transfer;with modified independence;shower seat;ambulating (with least restrictive AD)   OT Frequency:  Min 1X/week    Co-evaluation PT/OT/SLP Co-Evaluation/Treatment: Yes Reason for Co-Treatment: To address functional/ADL transfers;Complexity of the patient's impairments (multi-system involvement)   OT goals addressed during session: ADL's and self-care      AM-PAC OT 6 Clicks Daily Activity     Outcome Measure Help from another person eating meals?: None Help from another person taking care of personal grooming?: A Little Help from another person toileting, which includes using toliet, bedpan, or urinal?: A Little Help from another person bathing (including washing, rinsing, drying)?: A Little Help from another person to put on and taking off regular upper body clothing?: None Help from another person to put on and taking off regular lower body clothing?: A Little 6 Click Score: 20   End of Session Equipment Utilized During Treatment: Gait belt;Rolling walker (2 wheels);Other (comment) (R post op shoe) Nurse Communication: Mobility status;Weight bearing status;Precautions  Activity Tolerance: Patient tolerated treatment well Patient left: in bed;with call bell/phone within reach;with family/visitor present (sitting EOB)  OT Visit Diagnosis: Unsteadiness on feet (R26.81);Pain                Time: 8697-8664 OT Time Calculation (min): 33 min Charges:  OT General Charges $OT Visit: 1 Visit OT Evaluation $OT Eval Moderate Complexity: 1 Mod  Margarie Rockey HERO., OTR/L, MA Acute  Rehab [phone]   Margarie FORBES Horns 11/09/2023, 1:58 PM

## 2023-11-09 NOTE — Progress Notes (Signed)
 Discharge Delay;   Needs:   Per wife patient needs to eat before discharge. Lunch was removed from room during his therapy session.  Patient will reorder lunch.   RW recommended before discharge.   CM to place order.   Per mobility patient eligible for lounge to wait for RW.  Once he has eaten his meal.  Odis RN updated.

## 2023-11-19 ENCOUNTER — Other Ambulatory Visit: Payer: Self-pay | Admitting: Family Medicine

## 2023-11-19 ENCOUNTER — Ambulatory Visit: Payer: Self-pay

## 2023-11-19 MED ORDER — OXYCODONE HCL 5 MG PO TABS: 5.0000 mg | ORAL_TABLET | Freq: Three times a day (TID) | ORAL | 0 refills | Status: AC | PRN

## 2023-11-19 NOTE — Telephone Encounter (Signed)
 Patient scheduled to see you 11/19. Would like pain meds to hold him over till appt.

## 2023-11-19 NOTE — Telephone Encounter (Signed)
 FYI Only or Action Required?: Action required by provider: clinical question for provider and update on patient condition.  Patient was last seen in primary care on 06/02/2021 by Katrinka Garnette KIDD, MD.  Called Nurse Triage reporting Pain.  Symptoms began 11/5.  Symptoms are: gradually worsening.  Triage Disposition: Call PCP Now  Patient/caregiver understands and will follow disposition?: Yes     Copied from CRM [id number]. Topic: Clinical - Red Word Triage >> Nov 19, 2023 11:21 AM Drema MATSU wrote: Red Word that prompted transfer to Nurse Triage: Patient is in extreme pain from accident. He was discharged from hospital on 11/07. Requesting pain meds      Reason for Disposition  Condition / symptoms WORSE  Answer Assessment - Initial Assessment Questions Patient has a follow up appointment on 11/19 and would like a prescription to help with his pain until then. Please advise.      1. MAIN CONCERN OR SYMPTOM:  What is your main concern right now? What question do you have? What's the main symptom you're worried about? (e.g., breathing difficulty, ankle swelling, weight gain.)     Pain 2. ONSET: When did the injury start?     11/5 3. BETTER-SAME-WORSE: Are you getting better, staying the same, or getting worse compared to the day you were discharged?     Worse  4. HOSPITALIZATION: How long were you hospitalized? (e.g., days)     2 days  5. DISCHARGE DIAGNOSIS:  What problem or disease were you hospitalized for?     Crush injury 6. DISCHARGE DATE: What date were you discharged from the hospital?     11/7 8. DISCHARGE APPOINTMENT: Have you scheduled a follow-up discharge appointment with your doctor?     11/19 9. DISCHARGE MEDICINES: Did the doctor (or NP/PA) who discharged you order any new medicines for you to use? If yes, have you filled the prescription and started taking the medicine?      Patient is out of medication from the hospital  10. PAIN:  Is there any pain? If Yes, ask: How bad is it?  (Scale 0-10; or none, mild, moderate, severe)       Moderate to severe  11. FEVER: Do you have a fever? If Yes, ask: What is it, how was it measured  and when did it start?       No 12. OTHER SYMPTOMS: Do you have any other symptoms?       No  Protocols used: Post-Hospitalization Follow-up Call-A-AH

## 2023-11-19 NOTE — Telephone Encounter (Signed)
 I refilled medications- keep visit on 19th

## 2023-11-21 ENCOUNTER — Ambulatory Visit (INDEPENDENT_AMBULATORY_CARE_PROVIDER_SITE_OTHER): Admitting: Family Medicine

## 2023-11-21 ENCOUNTER — Encounter: Payer: Self-pay | Admitting: Family Medicine

## 2023-11-21 VITALS — BP 142/90 | HR 63 | Temp 98.3°F | Ht 72.0 in | Wt 212.8 lb

## 2023-11-21 DIAGNOSIS — E785 Hyperlipidemia, unspecified: Secondary | ICD-10-CM

## 2023-11-21 DIAGNOSIS — Z1211 Encounter for screening for malignant neoplasm of colon: Secondary | ICD-10-CM

## 2023-11-21 DIAGNOSIS — T148XXA Other injury of unspecified body region, initial encounter: Secondary | ICD-10-CM

## 2023-11-21 DIAGNOSIS — Z131 Encounter for screening for diabetes mellitus: Secondary | ICD-10-CM

## 2023-11-21 DIAGNOSIS — I609 Nontraumatic subarachnoid hemorrhage, unspecified: Secondary | ICD-10-CM | POA: Diagnosis not present

## 2023-11-21 DIAGNOSIS — S92331A Displaced fracture of third metatarsal bone, right foot, initial encounter for closed fracture: Secondary | ICD-10-CM | POA: Diagnosis not present

## 2023-11-21 DIAGNOSIS — E663 Overweight: Secondary | ICD-10-CM | POA: Diagnosis not present

## 2023-11-21 DIAGNOSIS — M25562 Pain in left knee: Secondary | ICD-10-CM

## 2023-11-21 LAB — CBC WITH DIFFERENTIAL/PLATELET
Basophils Absolute: 0.1 K/uL (ref 0.0–0.1)
Basophils Relative: 1.2 % (ref 0.0–3.0)
Eosinophils Absolute: 0.3 K/uL (ref 0.0–0.7)
Eosinophils Relative: 5.8 % — ABNORMAL HIGH (ref 0.0–5.0)
HCT: 41.6 % (ref 39.0–52.0)
Hemoglobin: 14.4 g/dL (ref 13.0–17.0)
Lymphocytes Relative: 17.2 % (ref 12.0–46.0)
Lymphs Abs: 1 K/uL (ref 0.7–4.0)
MCHC: 34.5 g/dL (ref 30.0–36.0)
MCV: 90.4 fl (ref 78.0–100.0)
Monocytes Absolute: 0.4 K/uL (ref 0.1–1.0)
Monocytes Relative: 6.5 % (ref 3.0–12.0)
Neutro Abs: 3.9 K/uL (ref 1.4–7.7)
Neutrophils Relative %: 69.3 % (ref 43.0–77.0)
Platelets: 374 K/uL (ref 150.0–400.0)
RBC: 4.61 Mil/uL (ref 4.22–5.81)
RDW: 13.4 % (ref 11.5–15.5)
WBC: 5.6 K/uL (ref 4.0–10.5)

## 2023-11-21 LAB — COMPREHENSIVE METABOLIC PANEL WITH GFR
ALT: 20 U/L (ref 0–53)
AST: 20 U/L (ref 0–37)
Albumin: 4.3 g/dL (ref 3.5–5.2)
Alkaline Phosphatase: 87 U/L (ref 39–117)
BUN: 28 mg/dL — ABNORMAL HIGH (ref 6–23)
CO2: 28 meq/L (ref 19–32)
Calcium: 9.2 mg/dL (ref 8.4–10.5)
Chloride: 104 meq/L (ref 96–112)
Creatinine, Ser: 0.95 mg/dL (ref 0.40–1.50)
GFR: 84.03 mL/min (ref >60.00)
Glucose, Bld: 108 mg/dL — ABNORMAL HIGH (ref 70–99)
Potassium: 4.4 meq/L (ref 3.5–5.1)
Sodium: 139 meq/L (ref 135–145)
Total Bilirubin: 0.4 mg/dL (ref 0.2–1.2)
Total Protein: 7.3 g/dL (ref 6.0–8.3)

## 2023-11-21 LAB — LDL CHOLESTEROL, DIRECT: Direct LDL: 167 mg/dL

## 2023-11-21 MED ORDER — TIZANIDINE HCL 2 MG PO TABS: 2.0000 mg | ORAL_TABLET | Freq: Four times a day (QID) | ORAL | 0 refills | Status: AC | PRN

## 2023-11-21 NOTE — Patient Instructions (Addendum)
 Grievance Hot Line for Hospital [phone]  [facility] follow up recommended from my perspective Phone: [phone]  Call Dr. Rollin to get scheduled colonoscopy follow up   We have placed a referral for you today to [facility] - please call their # if you do not hear within a week (may be listed below or you may see mychart message within a few days with #).   Trial tizanidine over methocarbamol - stop if similar side effects   Please stop by lab before you go If you have mychart- we will send your results within 3 business days of us  receiving them.  If you do not have mychart- we will call you about results within 5 business days of us  receiving them.  *please also note that you will see labs on mychart as soon as they post. I will later go in and write notes on them- will say notes from Dr. Katrinka   Recommended follow up: Return in about 6 months (around 05/20/2024) for physical or sooner if needed.Schedule b4 you leave.  Ok to do 4 months if available

## 2023-11-21 NOTE — Progress Notes (Signed)
 Phone [phone] In person visit   Subjective:   Mike Edwards is a 63 y.o. year old very pleasant male patient who presents for/with See problem oriented charting Chief Complaint  Patient presents with   Hospitalization Follow-up    Past Medical History-  Patient Active Problem List   Diagnosis Date Noted   Hyperlipidemia 08/25/2016    Priority: Medium    Hyperglycemia 08/25/2016    Priority: Medium    Numbness and tingling of right arm 02/23/2014    Priority: Medium    GERD (gastroesophageal reflux disease)     Priority: Medium    Chronic cough 12/22/2014    Priority: Low   History of colonic polyps 12/02/2014    Priority: Low   Crush injury 11/07/2023   AKI (acute kidney injury) 11/07/2023    Medications- reviewed and updated Current Outpatient Medications  Medication Sig Dispense Refill   omeprazole  (PRILOSEC) 40 MG capsule Take 1 capsule (40 mg total) by mouth daily. 90 capsule 3   oxyCODONE  (ROXICODONE ) 5 MG immediate release tablet Take 1 tablet (5 mg total) by mouth every 8 (eight) hours as needed for up to 5 days. 15 tablet 0   tadalafil  (CIALIS ) 20 MG tablet Take 1 tablet (20 mg total) by mouth every 3 (three) days as needed for erectile dysfunction. 90 tablet 1   tiZANidine  (ZANAFLEX ) 2 MG tablet Take 1 tablet (2 mg total) by mouth every 6 (six) hours as needed for muscle spasms. 30 tablet 0   No current facility-administered medications for this visit.     Objective:  BP (!) 142/90   Pulse 63   Temp 98.3 F (36.8 C) (Temporal)   Ht 6' (1.829 m)   Wt 212 lb 12.8 oz (96.5 kg)   SpO2 97%   BMI 28.86 kg/m  Gen: NAD, resting comfortably CV: RRR no murmurs rubs or gallops Lungs: CTAB no crackles, wheeze, rhonchi Ext: no edema Skin: warm, dry Neuro: grossly normal, moves all extremities MSK: Wearing left knee brace.  Pain with palpation medial to left scapula     Assessment and Plan   # Hospital follow-up after traumatic crush injury S:  Patient had been working in his workshop and a psychologist, sport and exercise approximately 1200 (he updated this calculation and it was 3200) pounds fell on him.  He was lodged under this for over 30 minutes before he could be extracted.  He did have a head injury and did lose consciousness.  Thankfully his watch detected the fall and called EMS.  Creatinine had increased from baseline to 1.64.  AST mildly elevated -recheck today.  Initial lactic acid was elevated but improved to normal range on repeat.  Ethanol level negative -Thankfully acute kidney injury later resolved on repeat labs down to 1.06  Extensive trauma imaging -knee x-ray- small joint effusion, some arthritis- no fracture. He reports improving - Chest x-ray - Pelvic x-ray - Left elbow x-ray. normal - Left foot x-ray- no fracture- arthritis with first MTP joint.  - Right foot x-rayed-minimally displaced fracture of the third metatarsal  -CT of the head with soft tissue injury noted but no intracranial abnormality or skull fracture - CT cervical spine showed widespread cervical spinal canal hemorrhage of unclear etiology with subcutaneous hematoma at C6 and C7 -MRI cervical spine showed subarachnoid hemorrhage within the spinal canal from C3-C7 and concern for fracture vs marrow changes from trauma. Disc prortrusion c6-c7 with moderate central and severe right foraminal stenosis. Broad based disc osteophytes c4-c5  and c5-c6 asymmetric to the elevated LFTs with moderate to severe left foraminal stenosis.  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  They did not recommend cervical spine collar.  He was discharged for outpatient follow-up  Orthopedics was also consulted and recommended cam boot or postop shoe for right foot fracture and outpatient follow-up. He states  tried post outpatient shoe and cam  walker boot he has at home- he felt like it felt worse than wearing his [place] regularly  He was given very sparing oxycodone No. 6 tablets total and recommended ibuprofen-this is less than ideal with his hypertension and recommended against this. If takes methocarbamol some word finding difficulty and increase stuttter- resolves off medicine.   No headache(s)- ongoing neck pain and soreness- he thinks he may have landed on a 2x4 on the week. Dull pain about 6 inches down he feels in neck and to left of neck- sharp pain. Oxycodone  No worsening back pain or neck pain. Does have some pain inside scapula  Reports had intentionally lost 40 lbs over months and months - improved diet and no alcohol since march  A/P: Patient here for hospital follow-up for traumatic crush injury - For the right foot third mildly displaced metatarsal fracture he is not wearing a walking boot or cam walker-reports his pain is worse in these-I expressed my concern that he would like to continue with the current plan-I did recommend referral to sports medicine so they can evaluate for appropriate healing and he is in agreement - He would also like to talk to sports medicine about his left knee which he reports is more painful than the foot -With subarachnoid hemorrhage and cervical spine I will also recommend follow-up with [facility]-we gave him the number to call to schedule this today - Ongoing pain in the neck and the foot and the knee-mainly using oxycodone to help him sleep especially since the methocarbamol has made him not feel as cognitively clear - Trial tizanidine over methocarbamol - stop if similar side effects  -With acute kidney injury in the hospital recheck BMP today -Also had some mild anemia and recheck CBC  #hypertension S: medication: Amlodipine 5 mg previously prescribed but he reports he has not been taking this Home readings #s: Reports in normal range but unable to tell me specific  numbers BP Readings from Last 3 Encounters:  11/21/23 (!) 142/90  11/09/23 (!) 141/100  06/02/21 100/80  A/P: Blood pressure previously elevated and prescribed amlodipine but he reports tremendous weight loss and good control at home-asked him to update me with home readings in a few days  #hyperlipidemia #aortic atherosclerosis - noted on CT in november S: Medication: rosuvastatin 10 mg- hasn't been taking Lab Results  Component Value Date   CHOL 257 (H) 06/02/2021   HDL 65.00 06/02/2021   LDLCALC 174 (H) 06/02/2021   TRIG 91.0 06/02/2021   CHOLHDL 4 06/02/2021   A/P: In the past cholesterol has been significantly elevated-with new finding of aortic atherosclerosis discussed LDL goal 70 or less-we discussed checking direct LDL today   # Hyperglycemia/insulin resistance/prediabetes. A1c 5.8 9 months ago S:  Medication: none Exercise and diet-reports being 32 pounds down with weight loss efforts Lab Results  Component Value Date   HGBA1C 5.7 06/02/2021   HGBA1C 5.5 08/25/2016   HGBA1C 5.8 03/19/2014    A/P: Prediabetes  noted-wants to work on diet and exercise-update A1c with labs today  Recommended follow up: Return in about 6 months (around 05/20/2024) for physical or sooner if needed.Schedule b4 you leave. Future Appointments  Date Time Provider Department Center  04/15/2024  2:00 PM Katrinka Garnette KIDD, MD [facility]    Lab/Order associations:   ICD-10-CM   1. Closed displaced fracture of third metatarsal bone of right foot, initial encounter  S92.331A Ambulatory referral to Sports Medicine    2. Hyperlipidemia, unspecified hyperlipidemia type  E78.5 Comprehensive metabolic panel with GFR    CBC with Differential/Platelet    LDL cholesterol, direct    3. Subarachnoid hemorrhage (HCC)  I60.9     4. Screening for diabetes mellitus  Z13.1 Hemoglobin A1c    5. Overweight  E66.3 Hemoglobin A1c    6. Screen for colon cancer  Z12.11 Ambulatory referral to  Gastroenterology    7. Acute pain of left knee  M25.562 Ambulatory referral to Sports Medicine    8. Crush injury  T14.8XXA       Meds ordered this encounter  Medications   tiZANidine  (ZANAFLEX ) 2 MG tablet    Sig: Take 1 tablet (2 mg total) by mouth every 6 (six) hours as needed for muscle spasms.    Dispense:  30 tablet    Refill:  0   I personally spent a total of 65 minutes in the care of the patient today including preparing to see the patient including review of discharge summary and radiography, getting/reviewing separately obtained history, performing a medically appropriate exam/evaluation, counseling and educating and discussing his concerns about care he received in the hospital as well as discussing importance of home blood pressure monitoring since he believes he has whitecoat hypertension, and documenting clinical information in the EHR.  Return precautions advised.  Garnette Katrinka, MD

## 2023-11-22 ENCOUNTER — Ambulatory Visit: Payer: Self-pay | Admitting: Family Medicine

## 2023-11-22 LAB — HEMOGLOBIN A1C: Hgb A1c MFr Bld: 5.4 % (ref 4.6–6.5)

## 2023-11-22 MED ORDER — ROSUVASTATIN CALCIUM 10 MG PO TABS
10.0000 mg | ORAL_TABLET | Freq: Every day | ORAL | 3 refills | Status: AC
Start: 2023-11-22 — End: ?

## 2023-12-07 NOTE — Progress Notes (Signed)
 Ben Kerie Badger D.[facility] [address] [place] [address] Phone: [phone]   Assessment and Plan:     1. Closed nondisplaced fracture of third metatarsal bone of right foot, initial encounter 2. Right foot pain (Primary) -Acute, initial sports medicine visit - Closed minimally displaced fracture of third metatarsal bone from crush injury on 11/07/2023 - Repeat x-ray obtained at today's visit.  My interpretation: Continued evidence of minimally displaced fracture of third metatarsal with interval healing - Start meloxicam  15 mg daily x2 weeks.  If still having pain after 2 weeks, complete 3rd-week of NSAID. May use remaining NSAID as needed once daily for pain control.  Do not to use additional over-the-counter NSAIDs (ibuprofen , naproxen, Advil , Aleve, etc.) while taking prescription NSAIDs.  May use Tylenol  [phone] mg 2 to 3 times a day for breakthrough pain. - Recommend using comfortable footwear with goal of pain-free ambulation.  May use lace up ankle brace as needed.  No lifting >15 pounds to decrease strain on foot  3. Thoracic spine pain 6.  C6 traumatic noncompressive fracture -Acute, initial sports medicine visit - Upper back pain that worsened after a sharp, popping sensation. - X-ray obtained to rule out delayed vertebral fracture.  My interpretation: No acute fracture or dislocation - Start meloxicam  15 mg daily x2 weeks.  If still having pain after 2 weeks, complete 3rd-week of NSAID. May use remaining NSAID as needed once daily for pain control.  Do not to use additional over-the-counter NSAIDs (ibuprofen , naproxen, Advil , Aleve, etc.) while taking prescription NSAIDs.  May use Tylenol  [phone] mg 2 to 3 times a day for breakthrough pain. - Recommend establishing with neurosurgery outpatient due to multiple findings on C-spine MRI including subarachnoid hemorrhage from C3-C7, C6 traumatic noncompressive fracture, posterior ligamentous  complex injury, disc protrusion at C6/7, severe foraminal stenosis left-sided C5-C6.  I do not feel that this is an urgent referral as patient is overall gradually improving, however with multiple findings, I do feel patient should establish care and ensure that no further workup is necessary and  4. Acute pain of left knee 5. Crush injury -Acute, initial sports medicine visit - Multiple additional areas of musculoskeletal pain including left knee, left foot, pelvis, neck, upper back - Start meloxicam  15 mg daily x2 weeks.  If still having pain after 2 weeks, complete 3rd-week of NSAID. May use remaining NSAID as needed once daily for pain control.  Do not to use additional over-the-counter NSAIDs (ibuprofen , naproxen, Advil , Aleve, etc.) while taking prescription NSAIDs.  May use Tylenol  [phone] mg 2 to 3 times a day for breakthrough pain.  Will not plan on long-term NSAIDs with past medical history of hypertension    Time of visit 62 minutes, which included chart review, physical exam, treatment plan being performed, interpreted, and discussed with patient at today's visit.   Pertinent previous records reviewed include x-rays, CT scans, MRI from the ER visit 11/07/2023, ER note 11/07/2023, primary care note 11/21/2023   Follow Up: 3 to 4 weeks for reevaluation.  Could consider advanced imaging of areas of remaining pain.  Could consider CSI versus HEP versus physical therapy though patient prefers home exercises over PT   Subjective:   I, Claretha Schimke am a scribe for Dr. Leonce.    Chief Complaint: crush injury   HPI:   12/10/2023 Patient is a 63 year old male with crush injury. Patient states that he lost consciousness for 11 hours and the apple watch  called 911 for him. Had some leg shaking. Patient in boot on the right foot and brace on the left foot. Trouble areas right foot, left ankle and foot, left knee, and neck and spinal. None of the DME that he is currently wearing is doctor  prescribed. The patient owned these things already and says that he can't walk without them. Pelvis on the left side is an issue that he says that was not addressed.   Duration? November 5th Did you have an Injury to cause this pain?yes Taking Medication for pain? Oxycodone , tylenol , motrin  Numbness or Tingling? Yes  Does the pain Radiate? yes Altered gait or use? yes ROM/ impairment of movement? yes    Relevant Historical Information: GERD, hypertension  Additional pertinent review of systems negative.   Current Outpatient Medications:    meloxicam  (MOBIC ) 15 MG tablet, Take 1 tablet (15 mg total) by mouth daily., Disp: 30 tablet, Rfl: 0   omeprazole  (PRILOSEC) 40 MG capsule, Take 1 capsule (40 mg total) by mouth daily., Disp: 90 capsule, Rfl: 3   rosuvastatin  (CRESTOR ) 10 MG tablet, Take 1 tablet (10 mg total) by mouth daily., Disp: 90 tablet, Rfl: 3   tadalafil  (CIALIS ) 20 MG tablet, Take 1 tablet (20 mg total) by mouth every 3 (three) days as needed for erectile dysfunction., Disp: 90 tablet, Rfl: 1   tiZANidine  (ZANAFLEX ) 2 MG tablet, Take 1 tablet (2 mg total) by mouth every 6 (six) hours as needed for muscle spasms., Disp: 30 tablet, Rfl: 0   Objective:     Vitals:   12/10/23 1051  BP: (!) 160/90  Pulse: 90  SpO2: 99%  Weight: 218 lb (98.9 kg)  Height: 6' (1.829 m)      Body mass index is 29.57 kg/m.    Physical Exam:    Gen: Appears well, nad, nontoxic and pleasant Psych: Alert and oriented, appropriate mood and affect Neuro: sensation intact, strength is 5/5 with df/pf/inv/ev, muscle tone wnl Skin: no susupicious lesions or rashes  Right foot/ankle:  No deformity, Swelling along midfoot with mild erythema.  No open lesion.  TTP midfoot NTTP over fibular head, lat mal, medial mal, achilles, navicular, base of 5th, ATFL, CFL, deltoid, calcaneous   ROM DF 30, PF 45, inv/ev intact Antalgic gait walking with cane for assistance  TTP left medial ankle along the  deltoid ligament  TTP left thoracic paraspinal and rhomboid musculature, cervical spinous processes, cervical paraspinal  Mild left knee effusion with TTP medial lateral joint line  Electronically signed by:  Odis Mace D.[facility] 11:58 AM 12/10/23

## 2023-12-10 ENCOUNTER — Ambulatory Visit: Admitting: Sports Medicine

## 2023-12-10 ENCOUNTER — Ambulatory Visit

## 2023-12-10 VITALS — BP 160/90 | HR 90 | Ht 72.0 in | Wt 218.0 lb

## 2023-12-10 DIAGNOSIS — S92334A Nondisplaced fracture of third metatarsal bone, right foot, initial encounter for closed fracture: Secondary | ICD-10-CM

## 2023-12-10 DIAGNOSIS — T148XXA Other injury of unspecified body region, initial encounter: Secondary | ICD-10-CM

## 2023-12-10 DIAGNOSIS — M25562 Pain in left knee: Secondary | ICD-10-CM

## 2023-12-10 DIAGNOSIS — M79671 Pain in right foot: Secondary | ICD-10-CM

## 2023-12-10 DIAGNOSIS — M546 Pain in thoracic spine: Secondary | ICD-10-CM

## 2023-12-10 DIAGNOSIS — S12590D Other displaced fracture of sixth cervical vertebra, subsequent encounter for fracture with routine healing: Secondary | ICD-10-CM

## 2023-12-10 MED ORDER — MELOXICAM 15 MG PO TABS: 15.0000 mg | ORAL_TABLET | Freq: Every day | ORAL | 0 refills | Status: AC

## 2023-12-10 NOTE — Patient Instructions (Addendum)
-   Start meloxicam  15 mg daily x2 weeks.  If still having pain after 2 weeks, complete 3rd-week of NSAID. May use remaining NSAID as needed once daily for pain control.  Do not to use additional over-the-counter NSAIDs (ibuprofen , naproxen, Advil , Aleve, etc.) while taking prescription NSAIDs.  May use Tylenol  [phone] mg 2 to 3 times a day for breakthrough pain.  Pain free walking. Recommend using supportive tennis shoe for comfort. Can use lase up ankle brace as needed for support. Recommend establishing with Neurosurgery. Referral placed to [facility] Neurosurgery. Follow up in 3 to 4 weeks.

## 2023-12-14 ENCOUNTER — Encounter: Payer: Self-pay | Admitting: Sports Medicine

## 2023-12-14 ENCOUNTER — Ambulatory Visit: Payer: Self-pay | Admitting: Sports Medicine

## 2023-12-14 ENCOUNTER — Telehealth: Payer: Self-pay | Admitting: Sports Medicine

## 2023-12-14 DIAGNOSIS — S93621A Sprain of tarsometatarsal ligament of right foot, initial encounter: Secondary | ICD-10-CM

## 2023-12-14 DIAGNOSIS — T148XXA Other injury of unspecified body region, initial encounter: Secondary | ICD-10-CM

## 2023-12-14 DIAGNOSIS — S92334A Nondisplaced fracture of third metatarsal bone, right foot, initial encounter for closed fracture: Secondary | ICD-10-CM

## 2023-12-14 DIAGNOSIS — M79671 Pain in right foot: Secondary | ICD-10-CM

## 2023-12-14 NOTE — Telephone Encounter (Signed)
 Called and spoke with patient over the phone.  Discussed radiology review of right foot x-ray which showed third metatarsal fracture as previously discussed in clinic.  Additional finding of widening of gap between first metatarsal and medial cuneiform, concerning for Lisfranc injury.  With further comparison of updated x-ray versus x-ray after injury, there does appear to be interval increased widening at this junction.  Patient had significant tenderness of right foot originally thought to be due to metatarsal fracture, though could be multifactorial and include Lisfranc injury.  Crush injury that patient experienced could certainly cause a Lisfranc injury.  Discussed the importance of additional imaging to evaluate for Lisfranc injury with patient.  Recommend stat MRI right foot.  Recommend nonweightbearing, no driving until MRI results.  If Lisfranc injury is seen, will recommend orthopedic surgery referral and continue nonweightbearing status.  If no Lisfranc injury is seen, would continue weightbearing as tolerated and follow-up in clinic to review other areas of ongoing pain after crush injury.  Patient verbalized understanding and no further questions.

## 2023-12-17 ENCOUNTER — Ambulatory Visit: Payer: Self-pay | Admitting: Sports Medicine

## 2023-12-17 ENCOUNTER — Inpatient Hospital Stay: Admission: RE | Admit: 2023-12-17 | Discharge: 2023-12-17 | Attending: Sports Medicine

## 2023-12-17 DIAGNOSIS — S92334A Nondisplaced fracture of third metatarsal bone, right foot, initial encounter for closed fracture: Secondary | ICD-10-CM

## 2023-12-17 DIAGNOSIS — T148XXA Other injury of unspecified body region, initial encounter: Secondary | ICD-10-CM

## 2023-12-17 DIAGNOSIS — S93621A Sprain of tarsometatarsal ligament of right foot, initial encounter: Secondary | ICD-10-CM

## 2023-12-17 DIAGNOSIS — M79671 Pain in right foot: Secondary | ICD-10-CM

## 2024-01-07 NOTE — Progress Notes (Signed)
 "               Odis Mace D.[facility] [address] [place] [address] Phone: [phone]   Assessment and Plan:     1. Right foot pain (Primary) 2. Closed nondisplaced fracture of third metatarsal bone of right foot with routine healing, subsequent encounter 3. Crush injury -Subacute, improving, subsequent visit - Continued evidence of third metatarsal minimally displaced fracture with routine healing - Reviewed right foot MRI from 12/17/2023 which was reassuring and did not show evidence of Lisfranc injury which was a concern based on crush injury, prior x-ray imaging. - Start prednisone  Dosepak - Use meloxicam  15 mg daily as needed for breakthrough pain.  Recommend limiting chronic NSAIDs to 1-2 doses per week to prevent long-term side effects. Use Tylenol  500 to 1000 mg tablets 2-3 times a day as needed for day-to-day pain relief.    - Recommend continuing to use comfortable, stiff and supportive footwear to allow for proper foot healing.  Patient tried postop shoe previously, but felt that it caused more pain.  No lifting >15 pounds.  Continue this plan for an additional 4 weeks until reevaluated  4. Thoracic spine pain 6. Compression fracture of C6 vertebra with routine healing, subsequent encounter -Subacute, improving, subsequent visit - Patient had significant findings from C-spine MRI at ER visit.  He reached out to neurosurgery and per patient was told that as long as symptoms are gradually improving, may follow-up as needed. - No red flags at today's visit, so patient may contact neurosurgery as needed if symptoms worsen  5. Acute pain of left knee -Acute, mild improvement, subsequent visit - Continued pain and decreased range of motion of left knee - Start prednisone  Dosepak - Use meloxicam  15 mg daily as needed for breakthrough pain.  Recommend limiting chronic NSAIDs to 1-2 doses per week to prevent long-term side effects. Use Tylenol  500 to  1000 mg tablets 2-3 times a day as needed for day-to-day pain relief.      Time of visit 33 minutes, which included chart review, physical exam, treatment plan being performed, interpreted, and discussed with patient at today's visit.   Pertinent previous records reviewed include right foot MRI   Follow Up: 4 weeks for reevaluation.  Could consider repeat right foot x-ray versus left knee x-ray if no significant improvement.  Could consider physical therapy versus CSI.  Patient prefers home exercises over PT.   Subjective:   I, Chestine Reeves, am serving as a neurosurgeon for Doctor Morene Mace  Chief Complaint: crush injury    HPI:    12/10/2023 Patient is a 64 year old male with crush injury. Patient states that he lost consciousness for 11 hours and the apple watch called 911 for him. Had some leg shaking. Patient in boot on the right foot and brace on the left foot. Trouble areas right foot, left ankle and foot, left knee, and neck and spinal. None of the DME that he is currently wearing is doctor prescribed. The patient owned these things already and says that he can't walk without them. Pelvis on the left side is an issue that he says that was not addressed.    Duration? November 5th Did you have an Injury to cause this pain?yes Taking Medication for pain? Oxycodone , tylenol , motrin  Numbness or Tingling? Yes  Does the pain Radiate? yes Altered gait or use? yes ROM/ impairment of movement? yes   01/08/2024 Patient states he is doing  a little better. Slow progress     Relevant Historical Information: GERD, hypertension  Additional pertinent review of systems negative.  Current Medications[1]   Objective:     Vitals:   01/08/24 1036  Pulse: 80  SpO2: 98%  Weight: 216 lb (98 kg)  Height: 6' (1.829 m)      Body mass index is 29.29 kg/m.    Physical Exam:    Gen: Appears well, nad, nontoxic and pleasant Psych: Alert and oriented, appropriate mood and affect Neuro:  sensation intact, strength is 5/5 with df/pf/inv/ev, muscle tone wnl Skin: no susupicious lesions or rashes   Right foot/ankle:  No deformity, Swelling along midfoot with mild erythema.  No open lesion.  TTP midfoot NTTP over fibular head, lat mal, medial mal, achilles, navicular, base of 5th, ATFL, CFL, deltoid, calcaneous   ROM DF 30, PF 45, inv/ev intact Antalgic gait     TTP left medial ankle along the deltoid ligament      Mild left knee effusion with TTP medial lateral joint line    Electronically signed by:  Odis Mace [facility] 11:23 AM 01/08/2024     [1]  Current Outpatient Medications:    meloxicam  (MOBIC ) 15 MG tablet, Take 1 tablet (15 mg total) by mouth daily as needed for pain., Disp: 30 tablet, Rfl: 0   predniSONE  (STERAPRED UNI-PAK 21 TAB) 5 MG (21) TBPK tablet, 5 day taper 6 tablets day 1  5 tablets day 2 and so on, Disp: 21 each, Rfl: 0   meloxicam  (MOBIC ) 15 MG tablet, Take 1 tablet (15 mg total) by mouth daily., Disp: 30 tablet, Rfl: 0   omeprazole  (PRILOSEC) 40 MG capsule, Take 1 capsule (40 mg total) by mouth daily., Disp: 90 capsule, Rfl: 3   rosuvastatin  (CRESTOR ) 10 MG tablet, Take 1 tablet (10 mg total) by mouth daily., Disp: 90 tablet, Rfl: 3   tadalafil  (CIALIS ) 20 MG tablet, Take 1 tablet (20 mg total) by mouth every 3 (three) days as needed for erectile dysfunction., Disp: 90 tablet, Rfl: 1   tiZANidine  (ZANAFLEX ) 2 MG tablet, Take 1 tablet (2 mg total) by mouth every 6 (six) hours as needed for muscle spasms., Disp: 30 tablet, Rfl: 0  "

## 2024-01-08 ENCOUNTER — Ambulatory Visit: Admitting: Sports Medicine

## 2024-01-08 VITALS — HR 80 | Ht 72.0 in | Wt 216.0 lb

## 2024-01-08 DIAGNOSIS — T148XXA Other injury of unspecified body region, initial encounter: Secondary | ICD-10-CM | POA: Diagnosis not present

## 2024-01-08 DIAGNOSIS — M79671 Pain in right foot: Secondary | ICD-10-CM | POA: Diagnosis not present

## 2024-01-08 DIAGNOSIS — S92334D Nondisplaced fracture of third metatarsal bone, right foot, subsequent encounter for fracture with routine healing: Secondary | ICD-10-CM | POA: Diagnosis not present

## 2024-01-08 DIAGNOSIS — S12590D Other displaced fracture of sixth cervical vertebra, subsequent encounter for fracture with routine healing: Secondary | ICD-10-CM | POA: Diagnosis not present

## 2024-01-08 DIAGNOSIS — M25562 Pain in left knee: Secondary | ICD-10-CM

## 2024-01-08 DIAGNOSIS — M546 Pain in thoracic spine: Secondary | ICD-10-CM

## 2024-01-08 MED ORDER — PREDNISONE 5 MG (21) PO TBPK: ORAL_TABLET | ORAL | 0 refills | Status: AC

## 2024-01-08 MED ORDER — MELOXICAM 15 MG PO TABS: 15.0000 mg | ORAL_TABLET | Freq: Every day | ORAL | 0 refills | Status: AC | PRN

## 2024-01-08 NOTE — Patient Instructions (Signed)
 Prednisone  dos pak   - Use meloxicam  15 mg daily as needed for breakthrough pain.  Recommend limiting chronic NSAIDs to 1-2 doses per week to prevent long-term side effects. Use Tylenol  500 to 1000 mg tablets 2-3 times a day as needed for day-to-day pain relief.    Meloxicam  refill   Use supportive footwear for 4 more weeks  4 week follow up

## 2024-02-05 ENCOUNTER — Ambulatory Visit: Admitting: Sports Medicine

## 2024-04-15 ENCOUNTER — Encounter: Admitting: Family Medicine
